# Patient Record
Sex: Female | Born: 1935 | Race: White | Hispanic: No | Smoking: Former smoker
Health system: Southern US, Community
[De-identification: ages and names within clinical notes are randomized; demographics above are authoritative.]

## PROBLEM LIST (undated history)

## (undated) DIAGNOSIS — E559 Vitamin D deficiency, unspecified: Secondary | ICD-10-CM

## (undated) DIAGNOSIS — F419 Anxiety disorder, unspecified: Secondary | ICD-10-CM

## (undated) DIAGNOSIS — M1990 Unspecified osteoarthritis, unspecified site: Secondary | ICD-10-CM

## (undated) DIAGNOSIS — D649 Anemia, unspecified: Secondary | ICD-10-CM

## (undated) DIAGNOSIS — G309 Alzheimer's disease, unspecified: Secondary | ICD-10-CM

## (undated) DIAGNOSIS — K219 Gastro-esophageal reflux disease without esophagitis: Secondary | ICD-10-CM

## (undated) DIAGNOSIS — F0390 Unspecified dementia without behavioral disturbance: Secondary | ICD-10-CM

## (undated) DIAGNOSIS — F0280 Dementia in other diseases classified elsewhere without behavioral disturbance: Secondary | ICD-10-CM

---

## 2001-02-16 ENCOUNTER — Encounter: Payer: Self-pay | Admitting: Orthopedic Surgery

## 2001-02-19 ENCOUNTER — Inpatient Hospital Stay (HOSPITAL_COMMUNITY): Admission: RE | Admit: 2001-02-19 | Discharge: 2001-02-23 | Payer: Self-pay | Admitting: Orthopedic Surgery

## 2001-02-19 ENCOUNTER — Encounter: Payer: Self-pay | Admitting: Orthopedic Surgery

## 2001-02-22 ENCOUNTER — Encounter: Payer: Self-pay | Admitting: Orthopedic Surgery

## 2001-02-23 ENCOUNTER — Inpatient Hospital Stay (HOSPITAL_COMMUNITY)
Admission: RE | Admit: 2001-02-23 | Discharge: 2001-03-04 | Payer: Self-pay | Admitting: Physical Medicine & Rehabilitation

## 2001-03-29 ENCOUNTER — Encounter (HOSPITAL_COMMUNITY): Admission: RE | Admit: 2001-03-29 | Discharge: 2001-04-28 | Payer: Self-pay | Admitting: Orthopedic Surgery

## 2001-04-30 ENCOUNTER — Encounter (HOSPITAL_COMMUNITY): Admission: RE | Admit: 2001-04-30 | Discharge: 2001-05-30 | Payer: Self-pay | Admitting: Orthopedic Surgery

## 2003-05-23 ENCOUNTER — Encounter (HOSPITAL_COMMUNITY): Admission: RE | Admit: 2003-05-23 | Discharge: 2003-06-22 | Payer: Self-pay | Admitting: Family Medicine

## 2003-12-08 ENCOUNTER — Encounter: Admission: RE | Admit: 2003-12-08 | Discharge: 2003-12-08 | Payer: Self-pay | Admitting: Orthopedic Surgery

## 2005-02-27 ENCOUNTER — Encounter: Admission: RE | Admit: 2005-02-27 | Discharge: 2005-02-27 | Payer: Self-pay | Admitting: Orthopedic Surgery

## 2005-09-21 ENCOUNTER — Inpatient Hospital Stay (HOSPITAL_COMMUNITY): Admission: EM | Admit: 2005-09-21 | Discharge: 2005-09-26 | Payer: Self-pay | Admitting: Emergency Medicine

## 2005-09-26 ENCOUNTER — Inpatient Hospital Stay: Admission: AD | Admit: 2005-09-26 | Discharge: 2005-12-05 | Payer: Self-pay | Admitting: Family Medicine

## 2005-09-29 ENCOUNTER — Ambulatory Visit: Payer: Self-pay | Admitting: Family Medicine

## 2005-09-30 ENCOUNTER — Ambulatory Visit (HOSPITAL_COMMUNITY): Admission: RE | Admit: 2005-09-30 | Discharge: 2005-09-30 | Payer: Self-pay | Admitting: Family Medicine

## 2005-10-07 ENCOUNTER — Ambulatory Visit: Payer: Self-pay | Admitting: Orthopedic Surgery

## 2005-10-09 ENCOUNTER — Ambulatory Visit (HOSPITAL_COMMUNITY): Admission: RE | Admit: 2005-10-09 | Discharge: 2005-10-09 | Payer: Self-pay | Admitting: Family Medicine

## 2005-10-14 ENCOUNTER — Ambulatory Visit (HOSPITAL_COMMUNITY): Admission: RE | Admit: 2005-10-14 | Discharge: 2005-10-14 | Payer: Self-pay | Admitting: Family Medicine

## 2005-10-27 ENCOUNTER — Ambulatory Visit: Payer: Self-pay | Admitting: Family Medicine

## 2005-10-27 ENCOUNTER — Ambulatory Visit: Payer: Self-pay | Admitting: Orthopedic Surgery

## 2005-10-31 ENCOUNTER — Emergency Department (HOSPITAL_COMMUNITY): Admission: EM | Admit: 2005-10-31 | Discharge: 2005-10-31 | Payer: Self-pay | Admitting: Emergency Medicine

## 2005-11-03 ENCOUNTER — Ambulatory Visit (HOSPITAL_COMMUNITY): Admission: RE | Admit: 2005-11-03 | Discharge: 2005-11-03 | Payer: Self-pay | Admitting: Oncology

## 2005-12-05 ENCOUNTER — Ambulatory Visit: Payer: Self-pay | Admitting: Family Medicine

## 2005-12-08 ENCOUNTER — Ambulatory Visit: Payer: Self-pay | Admitting: Orthopedic Surgery

## 2005-12-14 ENCOUNTER — Emergency Department (HOSPITAL_COMMUNITY): Admission: EM | Admit: 2005-12-14 | Discharge: 2005-12-14 | Payer: Self-pay | Admitting: Emergency Medicine

## 2005-12-31 ENCOUNTER — Ambulatory Visit: Payer: Self-pay | Admitting: Family Medicine

## 2006-01-30 ENCOUNTER — Ambulatory Visit: Payer: Self-pay | Admitting: Family Medicine

## 2006-01-31 ENCOUNTER — Encounter (INDEPENDENT_AMBULATORY_CARE_PROVIDER_SITE_OTHER): Payer: Self-pay | Admitting: Family Medicine

## 2006-02-02 ENCOUNTER — Ambulatory Visit: Payer: Self-pay | Admitting: Orthopedic Surgery

## 2006-02-25 ENCOUNTER — Ambulatory Visit: Payer: Self-pay | Admitting: Family Medicine

## 2006-03-30 ENCOUNTER — Ambulatory Visit: Payer: Self-pay | Admitting: Family Medicine

## 2006-04-16 IMAGING — CR DG CHEST 1V PORT
1 series · 1 of 1 positions shown · non-contrast
Comparison: Chest radiograph 09/21/05.

CLINICAL DATA: Fever, congestion. 
 PORTABLE CHEST:

[view not recorded]
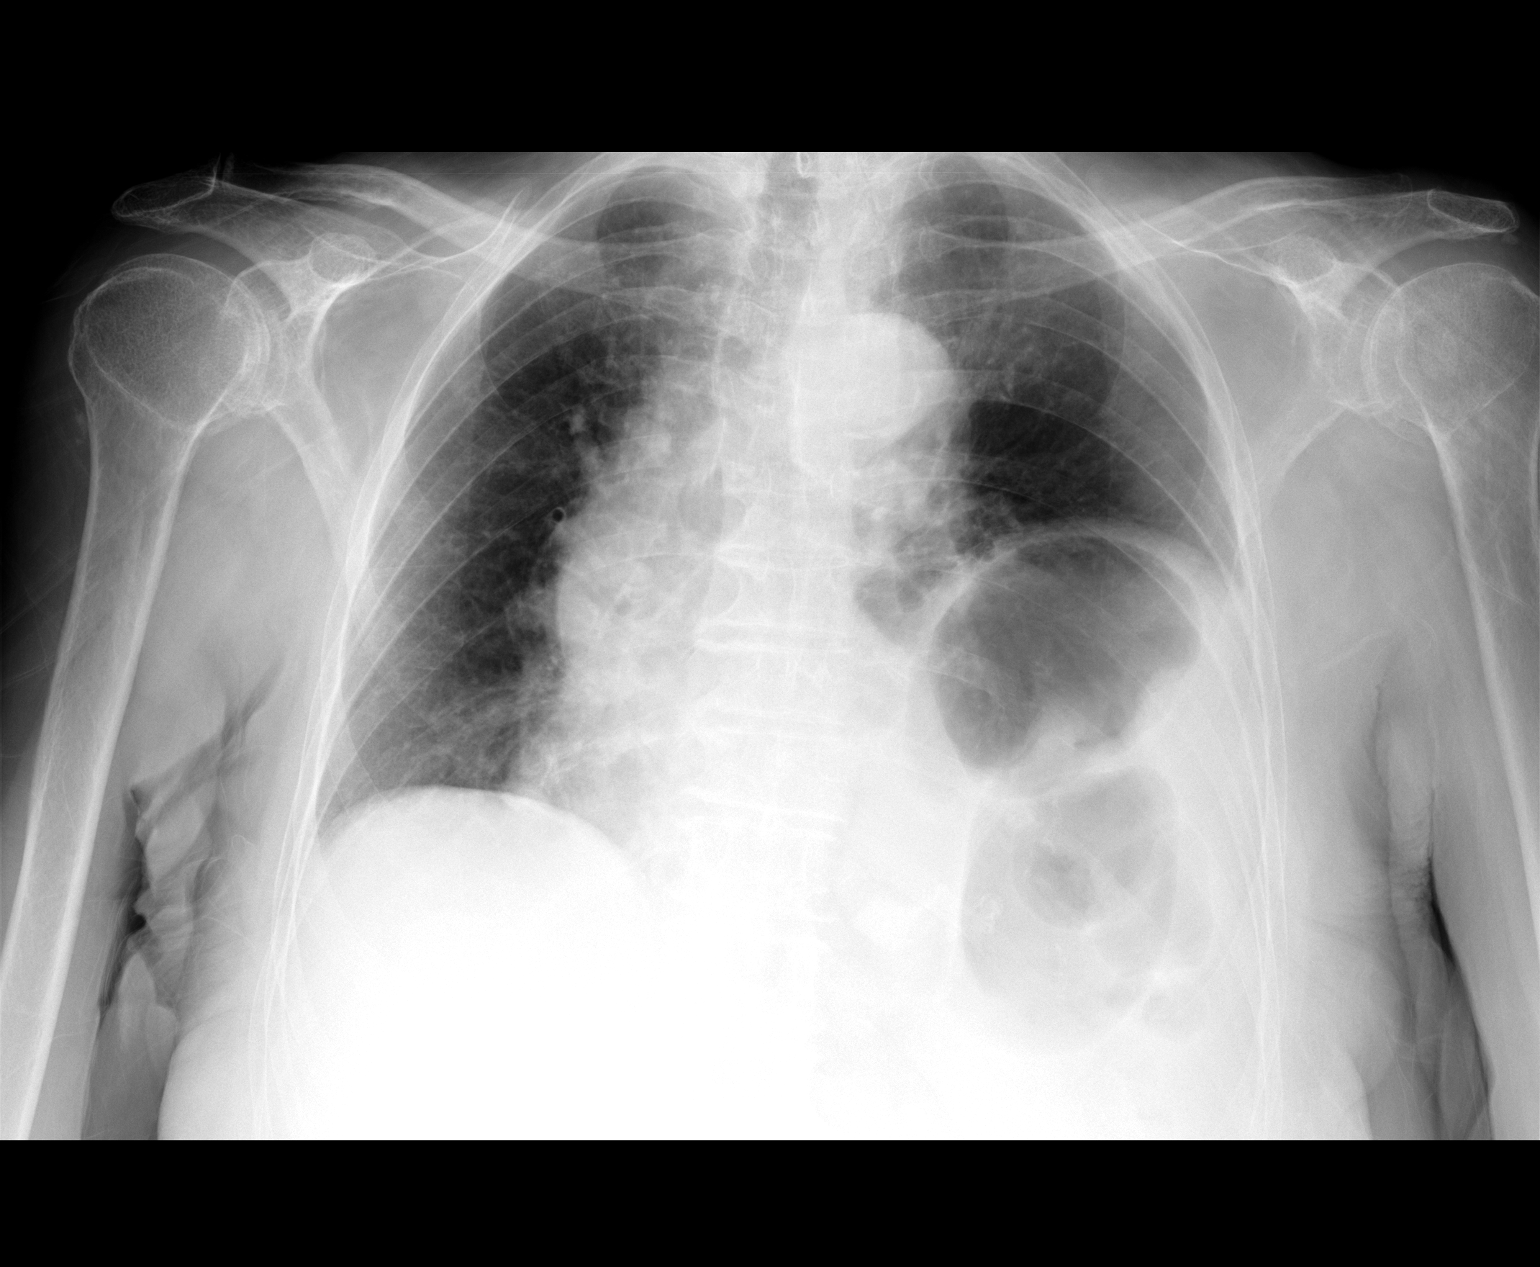

[1 of 1 positions shown; findings below may reference images not displayed]

FINDINGS: Heart size is normal.  The aorta is ectatic and unfolded, unchanged.  There is persistent elevation of the left hemidiaphragm.  Lungs are clear.  No effusion.  The bones are osteopenic with degenerative changes in the spine.
IMPRESSION: No acute cardiopulmonary process.

## 2006-04-19 IMAGING — CR DG HIP (WITH OR WITHOUT PELVIS) 2-3V*L*
4 series · 4 of 4 positions shown · non-contrast
Comparison: 09/21/05 and intraoperative views 09/23/05.

CLINICAL DATA: 85-year-old female status post ORIF left hip. 
LEFT HIP - 3 VIEW:

[view not recorded (1 of 4)]
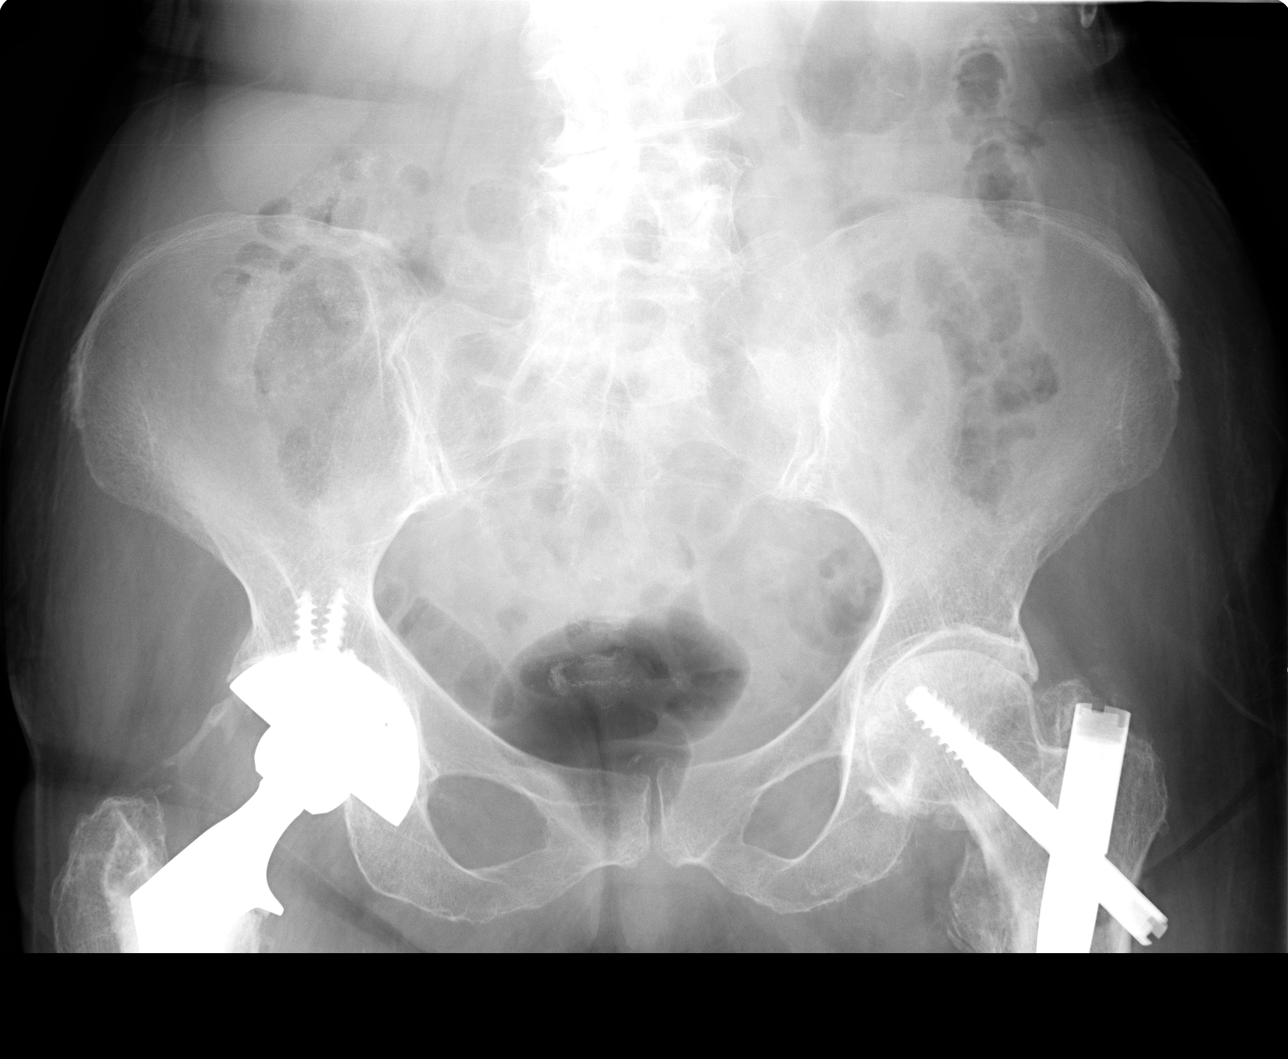

[view not recorded (2 of 4)]
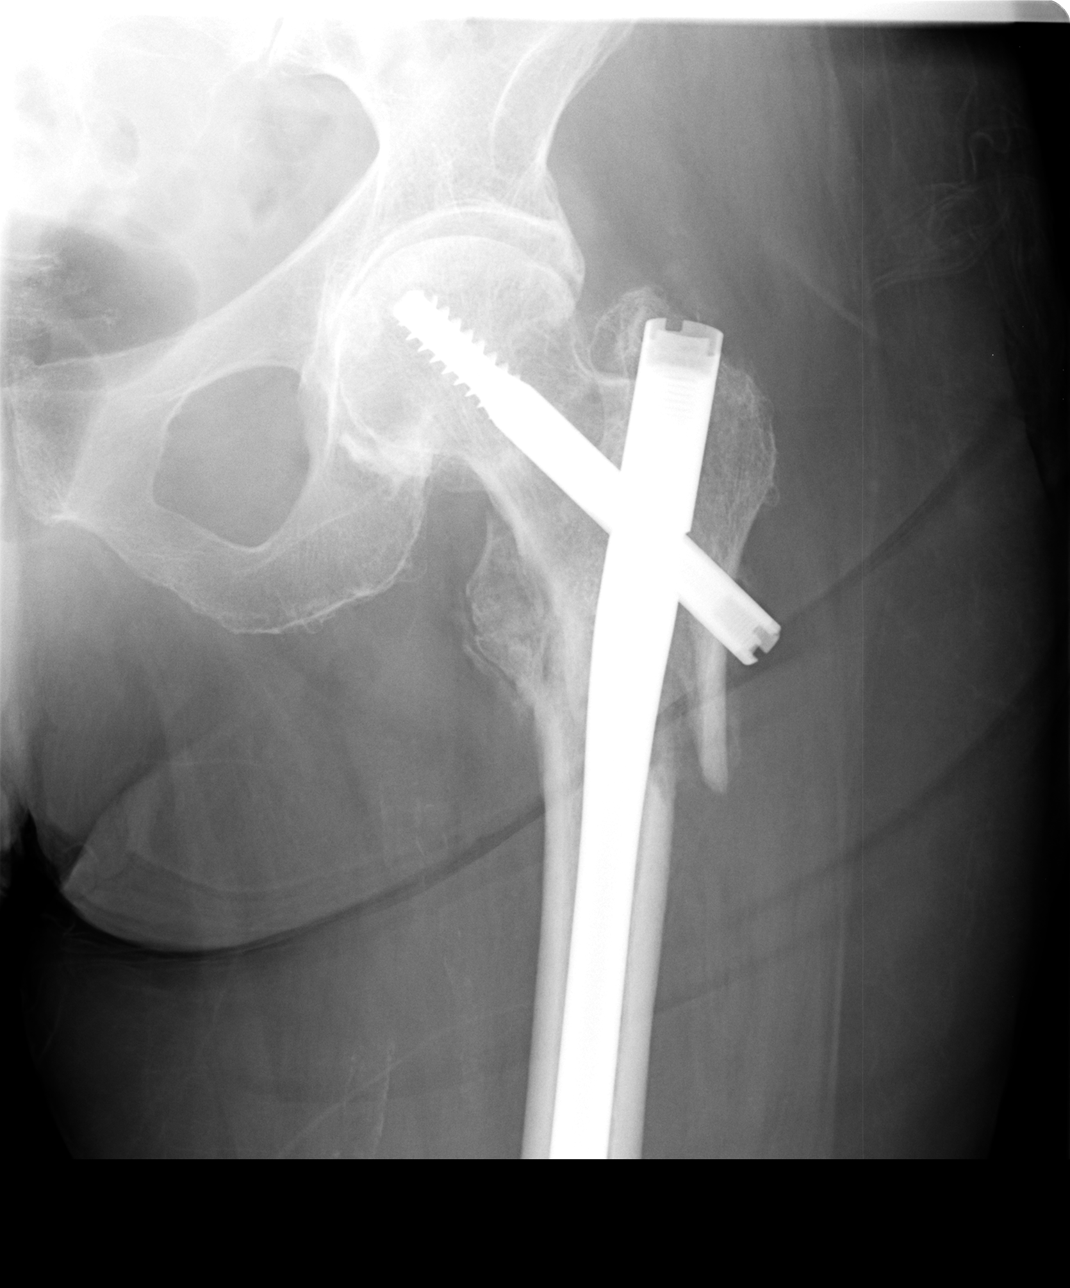

[view not recorded (3 of 4)]
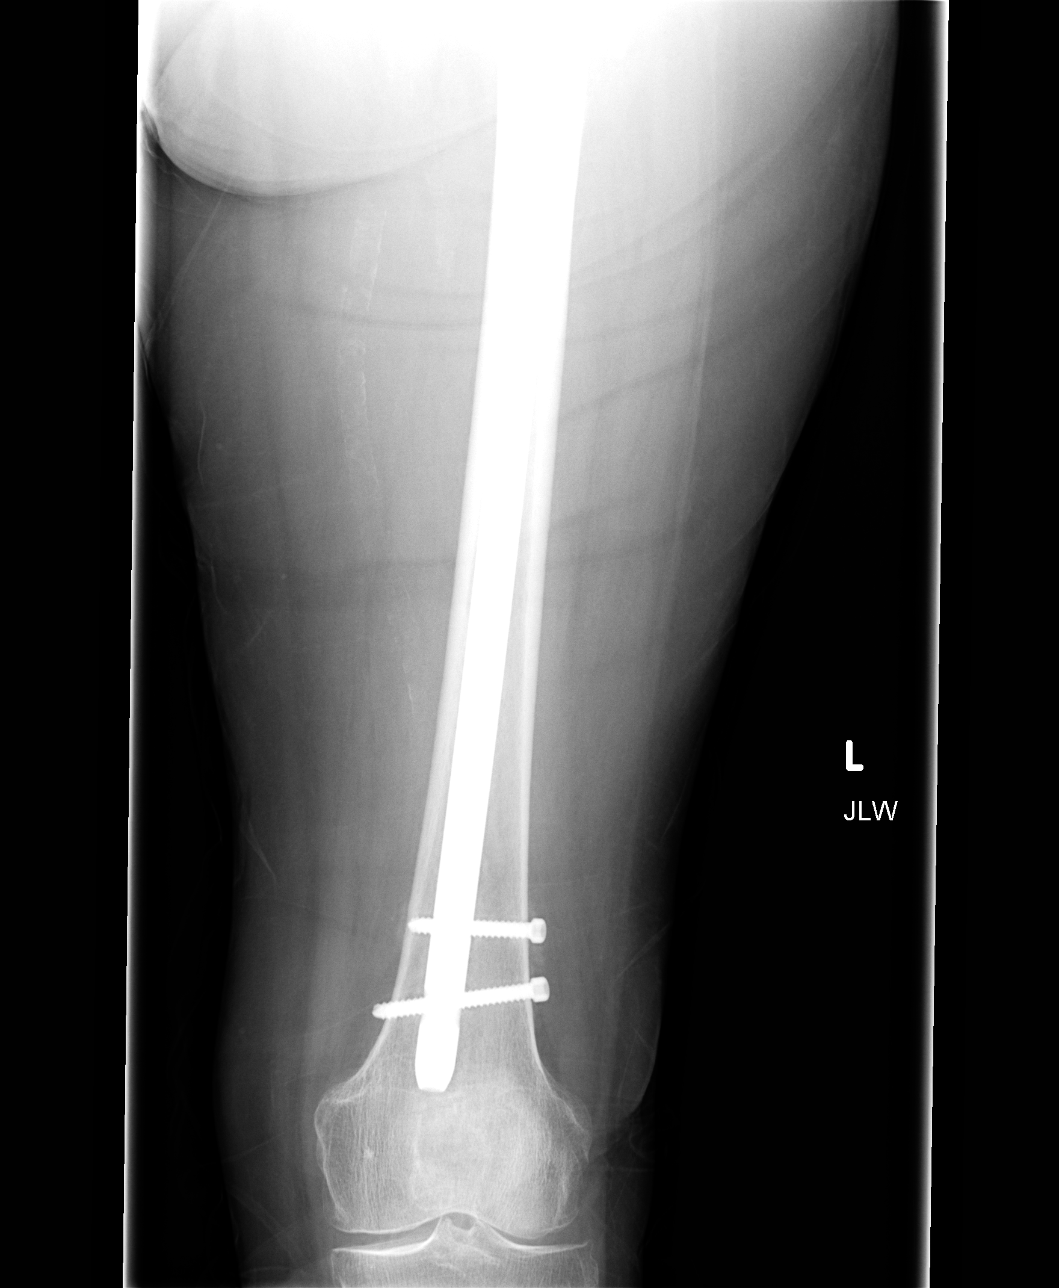

[view not recorded (4 of 4)]
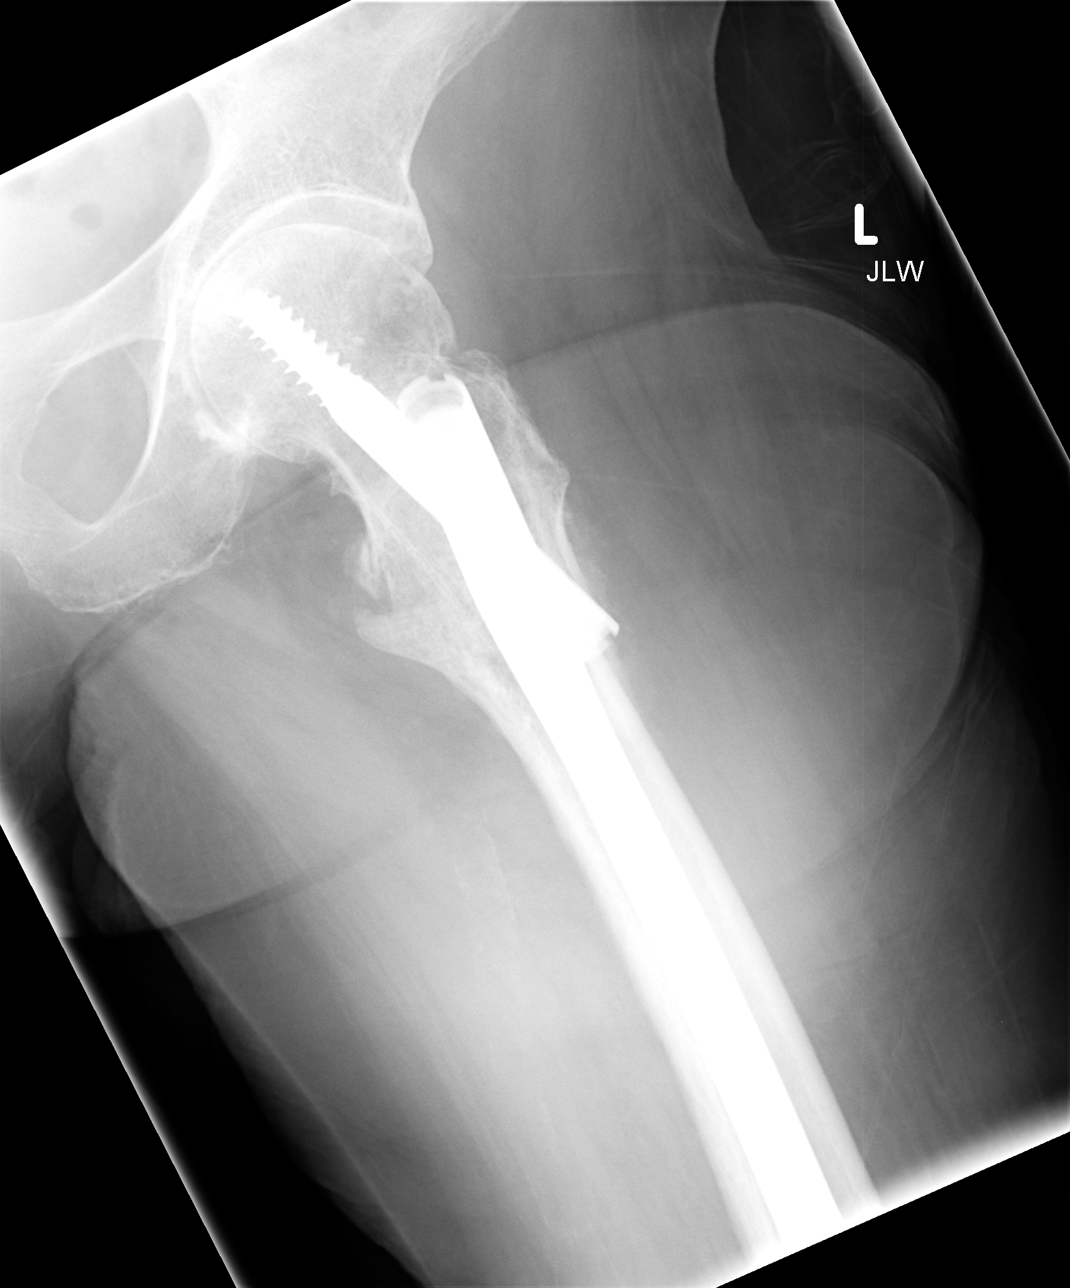

[4 of 4 positions shown; findings below may reference images not displayed]

FINDINGS: The patient is status post open reduction and internal fixation of the left hip.  The alignment is slightly displaced medially, although appears to be healing well.  There is minimal lucency about the transcervical neck screw which may represent hyperemia.  Hardware is otherwise intact.  Degenerative changes are noted in the hip joint.  Atherosclerotic changes are noted within the femoral artery.
IMPRESSION: 1.  Status post ORIF with some callus formation at the proximal femoral fracture site. 
2.  Mild lucency around the transcervical screw likely reflects hyperemia.  Infection cannot be entirely excluded.  Clinical correlation is recommended. 
3.  Osteoarthritic changes of the hip and knee.

## 2006-05-04 ENCOUNTER — Ambulatory Visit: Payer: Self-pay | Admitting: Orthopedic Surgery

## 2006-05-04 ENCOUNTER — Ambulatory Visit: Payer: Self-pay | Admitting: Family Medicine

## 2006-06-01 ENCOUNTER — Ambulatory Visit: Payer: Self-pay | Admitting: Family Medicine

## 2006-06-16 ENCOUNTER — Ambulatory Visit: Payer: Self-pay | Admitting: Family Medicine

## 2006-06-30 ENCOUNTER — Encounter: Payer: Self-pay | Admitting: Family Medicine

## 2006-06-30 DIAGNOSIS — F068 Other specified mental disorders due to known physiological condition: Secondary | ICD-10-CM

## 2006-06-30 DIAGNOSIS — F411 Generalized anxiety disorder: Secondary | ICD-10-CM | POA: Insufficient documentation

## 2006-06-30 DIAGNOSIS — M810 Age-related osteoporosis without current pathological fracture: Secondary | ICD-10-CM | POA: Insufficient documentation

## 2006-07-23 ENCOUNTER — Ambulatory Visit: Payer: Self-pay | Admitting: Family Medicine

## 2006-08-19 ENCOUNTER — Encounter (HOSPITAL_COMMUNITY): Admission: RE | Admit: 2006-08-19 | Discharge: 2006-09-18 | Payer: Self-pay | Admitting: Family Medicine

## 2006-10-07 ENCOUNTER — Ambulatory Visit (HOSPITAL_COMMUNITY): Admission: RE | Admit: 2006-10-07 | Discharge: 2006-10-07 | Payer: Self-pay | Admitting: Family Medicine

## 2006-10-07 ENCOUNTER — Ambulatory Visit: Payer: Self-pay | Admitting: Family Medicine

## 2006-10-07 DIAGNOSIS — D510 Vitamin B12 deficiency anemia due to intrinsic factor deficiency: Secondary | ICD-10-CM

## 2006-10-14 ENCOUNTER — Ambulatory Visit: Payer: Self-pay | Admitting: Family Medicine

## 2006-10-14 LAB — CONVERTED CEMR LAB
Protein, U semiquant: 30
pH: 5.5

## 2006-10-16 ENCOUNTER — Ambulatory Visit: Payer: Self-pay | Admitting: Family Medicine

## 2006-10-28 ENCOUNTER — Ambulatory Visit: Payer: Self-pay | Admitting: Family Medicine

## 2006-11-12 ENCOUNTER — Ambulatory Visit: Payer: Self-pay | Admitting: Family Medicine

## 2006-11-27 ENCOUNTER — Ambulatory Visit: Payer: Self-pay | Admitting: Family Medicine

## 2006-11-27 ENCOUNTER — Encounter (INDEPENDENT_AMBULATORY_CARE_PROVIDER_SITE_OTHER): Payer: Self-pay | Admitting: Internal Medicine

## 2006-12-15 ENCOUNTER — Telehealth (INDEPENDENT_AMBULATORY_CARE_PROVIDER_SITE_OTHER): Payer: Self-pay | Admitting: *Deleted

## 2006-12-15 ENCOUNTER — Encounter (INDEPENDENT_AMBULATORY_CARE_PROVIDER_SITE_OTHER): Payer: Self-pay | Admitting: Family Medicine

## 2006-12-22 ENCOUNTER — Encounter (INDEPENDENT_AMBULATORY_CARE_PROVIDER_SITE_OTHER): Payer: Self-pay | Admitting: Family Medicine

## 2006-12-29 ENCOUNTER — Ambulatory Visit: Payer: Self-pay | Admitting: Family Medicine

## 2007-01-01 ENCOUNTER — Encounter (INDEPENDENT_AMBULATORY_CARE_PROVIDER_SITE_OTHER): Payer: Self-pay | Admitting: Family Medicine

## 2007-01-07 ENCOUNTER — Encounter (INDEPENDENT_AMBULATORY_CARE_PROVIDER_SITE_OTHER): Payer: Self-pay | Admitting: Family Medicine

## 2007-01-14 ENCOUNTER — Encounter (INDEPENDENT_AMBULATORY_CARE_PROVIDER_SITE_OTHER): Payer: Self-pay | Admitting: Family Medicine

## 2007-01-15 ENCOUNTER — Encounter (INDEPENDENT_AMBULATORY_CARE_PROVIDER_SITE_OTHER): Payer: Self-pay | Admitting: Family Medicine

## 2007-01-21 ENCOUNTER — Ambulatory Visit: Payer: Self-pay | Admitting: Family Medicine

## 2007-01-22 ENCOUNTER — Encounter (INDEPENDENT_AMBULATORY_CARE_PROVIDER_SITE_OTHER): Payer: Self-pay | Admitting: Family Medicine

## 2007-01-22 ENCOUNTER — Telehealth (INDEPENDENT_AMBULATORY_CARE_PROVIDER_SITE_OTHER): Payer: Self-pay | Admitting: Family Medicine

## 2007-02-01 ENCOUNTER — Encounter (INDEPENDENT_AMBULATORY_CARE_PROVIDER_SITE_OTHER): Payer: Self-pay | Admitting: Family Medicine

## 2007-02-15 ENCOUNTER — Encounter (INDEPENDENT_AMBULATORY_CARE_PROVIDER_SITE_OTHER): Payer: Self-pay | Admitting: Family Medicine

## 2007-03-11 ENCOUNTER — Ambulatory Visit: Payer: Self-pay | Admitting: Family Medicine

## 2007-03-16 ENCOUNTER — Encounter (INDEPENDENT_AMBULATORY_CARE_PROVIDER_SITE_OTHER): Payer: Self-pay | Admitting: Family Medicine

## 2007-03-20 ENCOUNTER — Encounter (INDEPENDENT_AMBULATORY_CARE_PROVIDER_SITE_OTHER): Payer: Self-pay | Admitting: Family Medicine

## 2007-03-23 ENCOUNTER — Encounter (INDEPENDENT_AMBULATORY_CARE_PROVIDER_SITE_OTHER): Payer: Self-pay | Admitting: Family Medicine

## 2007-03-30 ENCOUNTER — Encounter (INDEPENDENT_AMBULATORY_CARE_PROVIDER_SITE_OTHER): Payer: Self-pay | Admitting: Family Medicine

## 2007-04-07 ENCOUNTER — Encounter (INDEPENDENT_AMBULATORY_CARE_PROVIDER_SITE_OTHER): Payer: Self-pay | Admitting: Family Medicine

## 2007-04-16 ENCOUNTER — Encounter (INDEPENDENT_AMBULATORY_CARE_PROVIDER_SITE_OTHER): Payer: Self-pay | Admitting: Family Medicine

## 2007-04-26 ENCOUNTER — Encounter (INDEPENDENT_AMBULATORY_CARE_PROVIDER_SITE_OTHER): Payer: Self-pay | Admitting: Family Medicine

## 2007-04-27 ENCOUNTER — Encounter (INDEPENDENT_AMBULATORY_CARE_PROVIDER_SITE_OTHER): Payer: Self-pay | Admitting: Family Medicine

## 2007-05-07 ENCOUNTER — Ambulatory Visit: Payer: Self-pay | Admitting: Family Medicine

## 2007-05-31 ENCOUNTER — Encounter (INDEPENDENT_AMBULATORY_CARE_PROVIDER_SITE_OTHER): Payer: Self-pay | Admitting: Family Medicine

## 2007-06-02 ENCOUNTER — Ambulatory Visit: Payer: Self-pay | Admitting: Family Medicine

## 2007-06-28 ENCOUNTER — Encounter (INDEPENDENT_AMBULATORY_CARE_PROVIDER_SITE_OTHER): Payer: Self-pay | Admitting: Family Medicine

## 2007-06-29 ENCOUNTER — Encounter (INDEPENDENT_AMBULATORY_CARE_PROVIDER_SITE_OTHER): Payer: Self-pay | Admitting: Family Medicine

## 2007-06-30 ENCOUNTER — Ambulatory Visit: Payer: Self-pay | Admitting: Family Medicine

## 2007-07-05 ENCOUNTER — Encounter (INDEPENDENT_AMBULATORY_CARE_PROVIDER_SITE_OTHER): Payer: Self-pay | Admitting: Family Medicine

## 2007-07-12 ENCOUNTER — Encounter (INDEPENDENT_AMBULATORY_CARE_PROVIDER_SITE_OTHER): Payer: Self-pay | Admitting: Family Medicine

## 2007-07-14 ENCOUNTER — Ambulatory Visit: Payer: Self-pay | Admitting: Family Medicine

## 2007-07-19 ENCOUNTER — Emergency Department (HOSPITAL_COMMUNITY): Admission: EM | Admit: 2007-07-19 | Discharge: 2007-07-20 | Payer: Self-pay | Admitting: *Deleted

## 2007-07-19 ENCOUNTER — Encounter (INDEPENDENT_AMBULATORY_CARE_PROVIDER_SITE_OTHER): Payer: Self-pay | Admitting: Family Medicine

## 2007-07-20 ENCOUNTER — Encounter (INDEPENDENT_AMBULATORY_CARE_PROVIDER_SITE_OTHER): Payer: Self-pay | Admitting: Family Medicine

## 2007-07-23 ENCOUNTER — Encounter (INDEPENDENT_AMBULATORY_CARE_PROVIDER_SITE_OTHER): Payer: Self-pay | Admitting: Family Medicine

## 2007-07-28 ENCOUNTER — Encounter (INDEPENDENT_AMBULATORY_CARE_PROVIDER_SITE_OTHER): Payer: Self-pay | Admitting: Family Medicine

## 2007-08-13 ENCOUNTER — Encounter (INDEPENDENT_AMBULATORY_CARE_PROVIDER_SITE_OTHER): Payer: Self-pay | Admitting: Family Medicine

## 2007-08-16 ENCOUNTER — Encounter (INDEPENDENT_AMBULATORY_CARE_PROVIDER_SITE_OTHER): Payer: Self-pay | Admitting: Family Medicine

## 2007-08-19 ENCOUNTER — Encounter (INDEPENDENT_AMBULATORY_CARE_PROVIDER_SITE_OTHER): Payer: Self-pay | Admitting: Family Medicine

## 2007-08-25 ENCOUNTER — Ambulatory Visit: Payer: Self-pay | Admitting: Family Medicine

## 2007-09-07 ENCOUNTER — Encounter (INDEPENDENT_AMBULATORY_CARE_PROVIDER_SITE_OTHER): Payer: Self-pay | Admitting: Family Medicine

## 2007-09-10 ENCOUNTER — Ambulatory Visit: Payer: Self-pay | Admitting: Family Medicine

## 2007-10-18 ENCOUNTER — Encounter (INDEPENDENT_AMBULATORY_CARE_PROVIDER_SITE_OTHER): Payer: Self-pay | Admitting: Family Medicine

## 2007-10-25 ENCOUNTER — Ambulatory Visit: Payer: Self-pay | Admitting: Family Medicine

## 2007-10-26 ENCOUNTER — Telehealth (INDEPENDENT_AMBULATORY_CARE_PROVIDER_SITE_OTHER): Payer: Self-pay | Admitting: *Deleted

## 2007-10-26 ENCOUNTER — Ambulatory Visit: Payer: Self-pay | Admitting: Family Medicine

## 2007-10-26 LAB — CONVERTED CEMR LAB
AST: 26 units/L (ref 0–37)
Alkaline Phosphatase: 67 units/L (ref 39–117)
Basophils Absolute: 0 10*3/uL (ref 0.0–0.1)
CO2: 18 meq/L — ABNORMAL LOW (ref 19–32)
Chloride: 106 meq/L (ref 96–112)
Eosinophils Absolute: 0.2 10*3/uL (ref 0.0–0.7)
Lymphocytes Relative: 26 % (ref 12–46)
Lymphs Abs: 1.2 10*3/uL (ref 0.7–4.0)
MCHC: 29.7 g/dL — ABNORMAL LOW (ref 30.0–36.0)
MCV: 100.3 fL — ABNORMAL HIGH (ref 78.0–100.0)
Monocytes Absolute: 0.5 10*3/uL (ref 0.1–1.0)
Monocytes Relative: 11 % (ref 3–12)
Neutro Abs: 2.8 10*3/uL (ref 1.7–7.7)
Platelets: 141 10*3/uL — ABNORMAL LOW (ref 150–400)
Potassium: 4.8 meq/L (ref 3.5–5.3)
Sodium: 142 meq/L (ref 135–145)
Total Bilirubin: 0.5 mg/dL (ref 0.3–1.2)
Total Protein: 7.1 g/dL (ref 6.0–8.3)
WBC: 4.7 10*3/uL (ref 4.0–10.5)

## 2007-10-27 ENCOUNTER — Telehealth (INDEPENDENT_AMBULATORY_CARE_PROVIDER_SITE_OTHER): Payer: Self-pay | Admitting: Family Medicine

## 2007-10-29 ENCOUNTER — Encounter (INDEPENDENT_AMBULATORY_CARE_PROVIDER_SITE_OTHER): Payer: Self-pay | Admitting: Family Medicine

## 2007-10-29 ENCOUNTER — Telehealth (INDEPENDENT_AMBULATORY_CARE_PROVIDER_SITE_OTHER): Payer: Self-pay | Admitting: Family Medicine

## 2007-11-01 LAB — CONVERTED CEMR LAB: Iron: 72 ug/dL (ref 42–145)

## 2007-11-03 ENCOUNTER — Telehealth (INDEPENDENT_AMBULATORY_CARE_PROVIDER_SITE_OTHER): Payer: Self-pay | Admitting: *Deleted

## 2007-11-03 ENCOUNTER — Encounter (INDEPENDENT_AMBULATORY_CARE_PROVIDER_SITE_OTHER): Payer: Self-pay | Admitting: Family Medicine

## 2007-11-04 ENCOUNTER — Encounter (INDEPENDENT_AMBULATORY_CARE_PROVIDER_SITE_OTHER): Payer: Self-pay | Admitting: Family Medicine

## 2007-11-16 ENCOUNTER — Encounter (INDEPENDENT_AMBULATORY_CARE_PROVIDER_SITE_OTHER): Payer: Self-pay | Admitting: Family Medicine

## 2007-11-17 ENCOUNTER — Encounter (INDEPENDENT_AMBULATORY_CARE_PROVIDER_SITE_OTHER): Payer: Self-pay | Admitting: Family Medicine

## 2007-11-22 ENCOUNTER — Encounter (INDEPENDENT_AMBULATORY_CARE_PROVIDER_SITE_OTHER): Payer: Self-pay | Admitting: Family Medicine

## 2007-12-03 ENCOUNTER — Encounter (INDEPENDENT_AMBULATORY_CARE_PROVIDER_SITE_OTHER): Payer: Self-pay | Admitting: Family Medicine

## 2007-12-07 ENCOUNTER — Ambulatory Visit: Payer: Self-pay | Admitting: Family Medicine

## 2007-12-07 DIAGNOSIS — J301 Allergic rhinitis due to pollen: Secondary | ICD-10-CM

## 2007-12-21 ENCOUNTER — Encounter (INDEPENDENT_AMBULATORY_CARE_PROVIDER_SITE_OTHER): Payer: Self-pay | Admitting: Family Medicine

## 2007-12-27 ENCOUNTER — Encounter (INDEPENDENT_AMBULATORY_CARE_PROVIDER_SITE_OTHER): Payer: Self-pay | Admitting: Family Medicine

## 2007-12-28 ENCOUNTER — Ambulatory Visit: Payer: Self-pay | Admitting: Family Medicine

## 2007-12-31 ENCOUNTER — Encounter (INDEPENDENT_AMBULATORY_CARE_PROVIDER_SITE_OTHER): Payer: Self-pay | Admitting: Family Medicine

## 2008-01-10 ENCOUNTER — Encounter (INDEPENDENT_AMBULATORY_CARE_PROVIDER_SITE_OTHER): Payer: Self-pay | Admitting: Family Medicine

## 2008-01-17 ENCOUNTER — Encounter (INDEPENDENT_AMBULATORY_CARE_PROVIDER_SITE_OTHER): Payer: Self-pay | Admitting: Family Medicine

## 2008-01-18 ENCOUNTER — Encounter (INDEPENDENT_AMBULATORY_CARE_PROVIDER_SITE_OTHER): Payer: Self-pay | Admitting: Family Medicine

## 2008-02-07 ENCOUNTER — Encounter (INDEPENDENT_AMBULATORY_CARE_PROVIDER_SITE_OTHER): Payer: Self-pay | Admitting: Family Medicine

## 2008-02-22 ENCOUNTER — Encounter (INDEPENDENT_AMBULATORY_CARE_PROVIDER_SITE_OTHER): Payer: Self-pay | Admitting: Family Medicine

## 2008-02-28 ENCOUNTER — Encounter (INDEPENDENT_AMBULATORY_CARE_PROVIDER_SITE_OTHER): Payer: Self-pay | Admitting: Family Medicine

## 2008-03-06 ENCOUNTER — Ambulatory Visit: Payer: Self-pay | Admitting: Family Medicine

## 2008-03-07 ENCOUNTER — Ambulatory Visit: Payer: Self-pay | Admitting: Family Medicine

## 2008-03-09 ENCOUNTER — Encounter (INDEPENDENT_AMBULATORY_CARE_PROVIDER_SITE_OTHER): Payer: Self-pay | Admitting: Family Medicine

## 2008-03-16 ENCOUNTER — Encounter (INDEPENDENT_AMBULATORY_CARE_PROVIDER_SITE_OTHER): Payer: Self-pay | Admitting: Family Medicine

## 2008-03-24 ENCOUNTER — Telehealth (INDEPENDENT_AMBULATORY_CARE_PROVIDER_SITE_OTHER): Payer: Self-pay | Admitting: *Deleted

## 2008-03-27 ENCOUNTER — Emergency Department (HOSPITAL_COMMUNITY): Admission: EM | Admit: 2008-03-27 | Discharge: 2008-03-27 | Payer: Self-pay | Admitting: Emergency Medicine

## 2008-03-27 ENCOUNTER — Encounter (INDEPENDENT_AMBULATORY_CARE_PROVIDER_SITE_OTHER): Payer: Self-pay | Admitting: Family Medicine

## 2008-04-20 ENCOUNTER — Encounter (INDEPENDENT_AMBULATORY_CARE_PROVIDER_SITE_OTHER): Payer: Self-pay | Admitting: Family Medicine

## 2008-04-21 ENCOUNTER — Encounter (INDEPENDENT_AMBULATORY_CARE_PROVIDER_SITE_OTHER): Payer: Self-pay | Admitting: Family Medicine

## 2008-05-04 ENCOUNTER — Encounter (INDEPENDENT_AMBULATORY_CARE_PROVIDER_SITE_OTHER): Payer: Self-pay | Admitting: Family Medicine

## 2008-05-09 ENCOUNTER — Ambulatory Visit: Payer: Self-pay | Admitting: Family Medicine

## 2008-05-15 ENCOUNTER — Encounter (INDEPENDENT_AMBULATORY_CARE_PROVIDER_SITE_OTHER): Payer: Self-pay | Admitting: Family Medicine

## 2008-05-24 ENCOUNTER — Encounter (INDEPENDENT_AMBULATORY_CARE_PROVIDER_SITE_OTHER): Payer: Self-pay | Admitting: Family Medicine

## 2008-05-25 ENCOUNTER — Encounter (INDEPENDENT_AMBULATORY_CARE_PROVIDER_SITE_OTHER): Payer: Self-pay | Admitting: Family Medicine

## 2008-06-06 ENCOUNTER — Ambulatory Visit: Payer: Self-pay | Admitting: Family Medicine

## 2008-06-07 ENCOUNTER — Encounter (INDEPENDENT_AMBULATORY_CARE_PROVIDER_SITE_OTHER): Payer: Self-pay | Admitting: Family Medicine

## 2008-06-08 LAB — CONVERTED CEMR LAB
Basophils Relative: 1 % (ref 0–1)
Eosinophils Absolute: 0.2 10*3/uL (ref 0.0–0.7)
Eosinophils Relative: 4 % (ref 0–5)
Lymphs Abs: 1.6 10*3/uL (ref 0.7–4.0)
MCHC: 32.7 g/dL (ref 30.0–36.0)
MCV: 101.2 fL — ABNORMAL HIGH (ref 78.0–100.0)
Monocytes Relative: 7 % (ref 3–12)
Platelets: 229 10*3/uL (ref 150–400)
RDW: 13.2 % (ref 11.5–15.5)

## 2008-06-13 ENCOUNTER — Encounter (INDEPENDENT_AMBULATORY_CARE_PROVIDER_SITE_OTHER): Payer: Self-pay | Admitting: Family Medicine

## 2008-06-13 ENCOUNTER — Ambulatory Visit: Payer: Self-pay | Admitting: Cardiology

## 2008-06-13 ENCOUNTER — Ambulatory Visit (HOSPITAL_COMMUNITY): Admission: RE | Admit: 2008-06-13 | Discharge: 2008-06-13 | Payer: Self-pay | Admitting: Family Medicine

## 2008-06-13 DIAGNOSIS — R0609 Other forms of dyspnea: Secondary | ICD-10-CM | POA: Insufficient documentation

## 2008-06-13 DIAGNOSIS — R0989 Other specified symptoms and signs involving the circulatory and respiratory systems: Secondary | ICD-10-CM

## 2008-06-21 ENCOUNTER — Ambulatory Visit: Payer: Self-pay | Admitting: Family Medicine

## 2008-06-23 ENCOUNTER — Ambulatory Visit: Payer: Self-pay | Admitting: Cardiology

## 2008-06-26 ENCOUNTER — Encounter (INDEPENDENT_AMBULATORY_CARE_PROVIDER_SITE_OTHER): Payer: Self-pay | Admitting: Family Medicine

## 2008-06-27 ENCOUNTER — Encounter (HOSPITAL_COMMUNITY): Admission: RE | Admit: 2008-06-27 | Discharge: 2008-07-27 | Payer: Self-pay | Admitting: Cardiology

## 2008-06-29 ENCOUNTER — Encounter (INDEPENDENT_AMBULATORY_CARE_PROVIDER_SITE_OTHER): Payer: Self-pay | Admitting: Family Medicine

## 2008-06-29 ENCOUNTER — Ambulatory Visit: Payer: Self-pay | Admitting: Cardiology

## 2008-07-10 ENCOUNTER — Ambulatory Visit: Payer: Self-pay | Admitting: Cardiology

## 2008-07-13 ENCOUNTER — Encounter (INDEPENDENT_AMBULATORY_CARE_PROVIDER_SITE_OTHER): Payer: Self-pay | Admitting: Family Medicine

## 2008-07-20 ENCOUNTER — Encounter (INDEPENDENT_AMBULATORY_CARE_PROVIDER_SITE_OTHER): Payer: Self-pay | Admitting: Family Medicine

## 2008-07-25 ENCOUNTER — Encounter (INDEPENDENT_AMBULATORY_CARE_PROVIDER_SITE_OTHER): Payer: Self-pay | Admitting: Family Medicine

## 2008-07-27 ENCOUNTER — Encounter (INDEPENDENT_AMBULATORY_CARE_PROVIDER_SITE_OTHER): Payer: Self-pay | Admitting: Family Medicine

## 2008-08-14 ENCOUNTER — Encounter (INDEPENDENT_AMBULATORY_CARE_PROVIDER_SITE_OTHER): Payer: Self-pay | Admitting: Family Medicine

## 2008-08-21 ENCOUNTER — Encounter (INDEPENDENT_AMBULATORY_CARE_PROVIDER_SITE_OTHER): Payer: Self-pay | Admitting: Family Medicine

## 2008-09-14 ENCOUNTER — Encounter (INDEPENDENT_AMBULATORY_CARE_PROVIDER_SITE_OTHER): Payer: Self-pay | Admitting: Family Medicine

## 2008-09-18 ENCOUNTER — Encounter (INDEPENDENT_AMBULATORY_CARE_PROVIDER_SITE_OTHER): Payer: Self-pay | Admitting: Family Medicine

## 2008-09-25 ENCOUNTER — Ambulatory Visit: Payer: Self-pay | Admitting: Family Medicine

## 2008-09-29 ENCOUNTER — Encounter (INDEPENDENT_AMBULATORY_CARE_PROVIDER_SITE_OTHER): Payer: Self-pay | Admitting: Family Medicine

## 2008-09-29 ENCOUNTER — Encounter: Payer: Self-pay | Admitting: Family Medicine

## 2008-10-17 ENCOUNTER — Encounter (INDEPENDENT_AMBULATORY_CARE_PROVIDER_SITE_OTHER): Payer: Self-pay | Admitting: Family Medicine

## 2008-11-08 ENCOUNTER — Encounter (INDEPENDENT_AMBULATORY_CARE_PROVIDER_SITE_OTHER): Payer: Self-pay | Admitting: Family Medicine

## 2008-11-09 ENCOUNTER — Encounter (INDEPENDENT_AMBULATORY_CARE_PROVIDER_SITE_OTHER): Payer: Self-pay | Admitting: Family Medicine

## 2008-11-10 ENCOUNTER — Encounter (INDEPENDENT_AMBULATORY_CARE_PROVIDER_SITE_OTHER): Payer: Self-pay | Admitting: Family Medicine

## 2008-11-21 ENCOUNTER — Encounter (INDEPENDENT_AMBULATORY_CARE_PROVIDER_SITE_OTHER): Payer: Self-pay | Admitting: Family Medicine

## 2008-11-22 ENCOUNTER — Ambulatory Visit: Payer: Self-pay | Admitting: Family Medicine

## 2008-12-05 ENCOUNTER — Ambulatory Visit: Payer: Self-pay | Admitting: Family Medicine

## 2008-12-05 DIAGNOSIS — R519 Headache, unspecified: Secondary | ICD-10-CM | POA: Insufficient documentation

## 2008-12-05 DIAGNOSIS — R51 Headache: Secondary | ICD-10-CM

## 2008-12-05 LAB — CONVERTED CEMR LAB
Glucose, Urine, Semiquant: NEGATIVE
Nitrite: NEGATIVE
Protein, U semiquant: NEGATIVE
Specific Gravity, Urine: <1.005
Urobilinogen, UA: 0.2

## 2008-12-06 ENCOUNTER — Encounter (INDEPENDENT_AMBULATORY_CARE_PROVIDER_SITE_OTHER): Payer: Self-pay | Admitting: Family Medicine

## 2008-12-07 LAB — CONVERTED CEMR LAB
AST: 23 units/L (ref 0–37)
Albumin: 4.4 g/dL (ref 3.5–5.2)
Alkaline Phosphatase: 59 units/L (ref 39–117)
BUN: 15 mg/dL (ref 6–23)
CO2: 26 meq/L (ref 19–32)
Calcium: 10.1 mg/dL (ref 8.4–10.5)
Chloride: 103 meq/L (ref 96–112)
Eosinophils Absolute: 0.1 10*3/uL (ref 0.0–0.7)
Eosinophils Relative: 2 % (ref 0–5)
Folate: >20 ng/mL
Glucose, Bld: 73 mg/dL (ref 70–99)
HCT: 41.2 % (ref 36.0–46.0)
HDL: 58 mg/dL (ref >39)
Neutro Abs: 4.1 10*3/uL (ref 1.7–7.7)
Neutrophils Relative %: 66 % (ref 43–77)
Potassium: 4.4 meq/L (ref 3.5–5.3)
RBC: 4.17 M/uL (ref 3.87–5.11)
RDW: 13 % (ref 11.5–15.5)
Sodium: 139 meq/L (ref 135–145)
Total Bilirubin: 0.5 mg/dL (ref 0.3–1.2)
Total Protein: 7.2 g/dL (ref 6.0–8.3)
Vitamin B-12: 753 pg/mL (ref 211–911)

## 2008-12-26 ENCOUNTER — Ambulatory Visit: Payer: Self-pay | Admitting: Family Medicine

## 2008-12-26 DIAGNOSIS — J209 Acute bronchitis, unspecified: Secondary | ICD-10-CM

## 2009-01-22 ENCOUNTER — Encounter (INDEPENDENT_AMBULATORY_CARE_PROVIDER_SITE_OTHER): Payer: Self-pay | Admitting: Family Medicine

## 2009-01-23 ENCOUNTER — Encounter (INDEPENDENT_AMBULATORY_CARE_PROVIDER_SITE_OTHER): Payer: Self-pay | Admitting: Family Medicine

## 2009-01-25 ENCOUNTER — Ambulatory Visit: Payer: Self-pay | Admitting: Family Medicine

## 2009-03-02 ENCOUNTER — Encounter (INDEPENDENT_AMBULATORY_CARE_PROVIDER_SITE_OTHER): Payer: Self-pay | Admitting: Family Medicine

## 2009-03-15 ENCOUNTER — Encounter (INDEPENDENT_AMBULATORY_CARE_PROVIDER_SITE_OTHER): Payer: Self-pay | Admitting: Family Medicine

## 2009-03-20 ENCOUNTER — Encounter (INDEPENDENT_AMBULATORY_CARE_PROVIDER_SITE_OTHER): Payer: Self-pay | Admitting: Family Medicine

## 2009-03-26 ENCOUNTER — Ambulatory Visit: Payer: Self-pay | Admitting: Family Medicine

## 2009-08-17 ENCOUNTER — Inpatient Hospital Stay (HOSPITAL_COMMUNITY): Admission: EM | Admit: 2009-08-17 | Discharge: 2009-08-19 | Payer: Self-pay | Admitting: Emergency Medicine

## 2010-02-22 ENCOUNTER — Observation Stay (HOSPITAL_COMMUNITY)
Admission: EM | Admit: 2010-02-22 | Discharge: 2010-02-23 | Payer: Self-pay | Source: Home / Self Care | Admitting: Emergency Medicine

## 2010-09-01 ENCOUNTER — Encounter: Payer: Self-pay | Admitting: Cardiology

## 2010-09-03 ENCOUNTER — Emergency Department (HOSPITAL_COMMUNITY)
Admission: EM | Admit: 2010-09-03 | Discharge: 2010-09-03 | Payer: Self-pay | Source: Home / Self Care | Admitting: Emergency Medicine

## 2010-09-04 LAB — COMPREHENSIVE METABOLIC PANEL
ALT: 15 U/L (ref 0–35)
BUN: 16 mg/dL (ref 6–23)
Glucose, Bld: 73 mg/dL (ref 70–99)
Total Protein: 7 g/dL (ref 6.0–8.3)

## 2010-09-04 LAB — DIFFERENTIAL
Eosinophils Absolute: 0.2 10*3/uL (ref 0.0–0.7)
Lymphocytes Relative: 21 % (ref 12–46)
Lymphs Abs: 1.5 10*3/uL (ref 0.7–4.0)
Monocytes Absolute: 0.5 10*3/uL (ref 0.1–1.0)
Neutro Abs: 5.3 10*3/uL (ref 1.7–7.7)
Neutrophils Relative %: 70 % (ref 43–77)

## 2010-09-04 LAB — LIPASE, BLOOD: Lipase: 54 U/L (ref 11–59)

## 2010-09-04 LAB — CBC
MCHC: 34.3 g/dL (ref 30.0–36.0)
MCV: 98.7 fL (ref 78.0–100.0)
Platelets: 185 10*3/uL (ref 150–400)
WBC: 7.5 10*3/uL (ref 4.0–10.5)

## 2010-09-04 LAB — URINALYSIS, ROUTINE W REFLEX MICROSCOPIC
Hgb urine dipstick: NEGATIVE
Ketones, ur: NEGATIVE mg/dL
Protein, ur: NEGATIVE mg/dL
Specific Gravity, Urine: <1.005 — ABNORMAL LOW (ref 1.005–1.030)
Urine Glucose, Fasting: NEGATIVE mg/dL
Urobilinogen, UA: 0.2 mg/dL (ref 0.0–1.0)

## 2010-09-04 LAB — CK TOTAL AND CKMB (NOT AT ARMC): Relative Index: INVALID (ref 0.0–2.5)

## 2010-09-04 LAB — TROPONIN I: Troponin I: 0.02 ng/mL (ref 0.00–0.06)

## 2010-09-08 LAB — CONVERTED CEMR LAB
ALT: 9 units/L (ref 0–35)
AST: 17 units/L (ref 0–37)
Albumin: 4.5 g/dL (ref 3.5–5.2)
BUN: 12 mg/dL (ref 6–23)
Basophils Absolute: 0 10*3/uL (ref 0.0–0.1)
Basophils Absolute: 0 10*3/uL (ref 0.0–0.1)
Basophils Relative: 0 % (ref 0–1)
Bilirubin Urine: NEGATIVE
Calcium: 9.5 mg/dL (ref 8.4–10.5)
Creatinine, Ser: 0.86 mg/dL (ref 0.40–1.20)
Eosinophils Absolute: 0.1 10*3/uL (ref 0.0–0.7)
Eosinophils Relative: 2 % (ref 0–5)
HCT: 45.6 % (ref 36.0–46.0)
Hemoglobin: 13.6 g/dL (ref 12.0–15.0)
Ketones, urine, test strip: NEGATIVE
Ketones, urine, test strip: NEGATIVE
Lymphocytes Relative: 20 % (ref 12–46)
Lymphocytes Relative: 22 % (ref 12–46)
Lymphs Abs: 1.4 10*3/uL (ref 0.7–3.3)
Lymphs Abs: 1.8 10*3/uL (ref 0.7–3.3)
MCHC: 31.8 g/dL (ref 30.0–36.0)
MCV: 101.3 fL — ABNORMAL HIGH (ref 78.0–100.0)
MCV: 105.6 fL — ABNORMAL HIGH (ref 78.0–100.0)
Monocytes Absolute: 0.4 10*3/uL (ref 0.2–0.7)
Monocytes Absolute: 0.6 10*3/uL (ref 0.2–0.7)
Neutrophils Relative %: 69 % (ref 43–77)
Potassium: 3.7 meq/L (ref 3.5–5.3)
Protein, U semiquant: 100
Protein, U semiquant: NEGATIVE
RBC: 3.93 M/uL (ref 3.87–5.11)
Specific Gravity, Urine: 1.015
Specific Gravity, Urine: >1.03 (ref 1.005–1.03)
Specific Gravity, Urine: >=1.03
TSH: 1.038 microintl units/mL (ref 0.350–5.50)
TSH: 1.297 microintl units/mL (ref 0.350–5.50)
Urine Glucose: NEGATIVE mg/dL
Urobilinogen, UA: 0.2
Urobilinogen, UA: 0.2 (ref 0.0–1.0)
pH: 5.5 (ref 5.0–8.0)

## 2010-09-10 NOTE — Letter (Signed)
Summary: history and physical   history and physical   Imported By: Curtis Sites 03/20/2009 15:04:22  _____________________________________________________________________  External Attachment:    Type:   Image     Comment:   External Document

## 2010-10-26 LAB — URINALYSIS, ROUTINE W REFLEX MICROSCOPIC
Bilirubin Urine: NEGATIVE
Hgb urine dipstick: NEGATIVE
Ketones, ur: NEGATIVE mg/dL
Nitrite: NEGATIVE

## 2010-10-26 LAB — COMPREHENSIVE METABOLIC PANEL
AST: 23 U/L (ref 0–37)
AST: 24 U/L (ref 0–37)
Albumin: 3.8 g/dL (ref 3.5–5.2)
Albumin: 3.9 g/dL (ref 3.5–5.2)
BUN: 12 mg/dL (ref 6–23)
BUN: 13 mg/dL (ref 6–23)
Calcium: 9.1 mg/dL (ref 8.4–10.5)
Calcium: 9.3 mg/dL (ref 8.4–10.5)
Chloride: 106 mEq/L (ref 96–112)
Chloride: 107 mEq/L (ref 96–112)
Creatinine, Ser: 0.76 mg/dL (ref 0.4–1.2)
Creatinine, Ser: 0.86 mg/dL (ref 0.4–1.2)
GFR calc Af Amer: >60 mL/min (ref >60)
GFR calc Af Amer: >60 mL/min (ref >60)
GFR calc non Af Amer: >60 mL/min (ref >60)
Total Bilirubin: 0.5 mg/dL (ref 0.3–1.2)
Total Bilirubin: 0.6 mg/dL (ref 0.3–1.2)
Total Protein: 6.7 g/dL (ref 6.0–8.3)

## 2010-10-26 LAB — DIFFERENTIAL
Eosinophils Relative: 3 % (ref 0–5)
Lymphocytes Relative: 24 % (ref 12–46)
Lymphs Abs: 2.3 10*3/uL (ref 0.7–4.0)
Monocytes Absolute: 0.7 10*3/uL (ref 0.1–1.0)
Monocytes Relative: 8 % (ref 3–12)
Neutro Abs: 6.3 10*3/uL (ref 1.7–7.7)

## 2010-10-26 LAB — CARDIAC PANEL(CRET KIN+CKTOT+MB+TROPI)
Relative Index: INVALID (ref 0.0–2.5)
Total CK: 75 U/L (ref 7–177)
Troponin I: 0.02 ng/mL (ref 0.00–0.06)

## 2010-10-26 LAB — URINE CULTURE
Colony Count: NO GROWTH
Culture: NO GROWTH

## 2010-10-26 LAB — CBC
HCT: 37.8 % (ref 36.0–46.0)
Hemoglobin: 12.9 g/dL (ref 12.0–15.0)
MCH: 34.2 pg — ABNORMAL HIGH (ref 26.0–34.0)
MCH: 34.2 pg — ABNORMAL HIGH (ref 26.0–34.0)
MCHC: 34.1 g/dL (ref 30.0–36.0)
MCHC: 34.2 g/dL (ref 30.0–36.0)
MCV: 100 fL (ref 78.0–100.0)
MCV: 100.4 fL — ABNORMAL HIGH (ref 78.0–100.0)
Platelets: 164 10*3/uL (ref 150–400)
Platelets: 170 10*3/uL (ref 150–400)
RBC: 3.78 MIL/uL — ABNORMAL LOW (ref 3.87–5.11)
RBC: 3.91 MIL/uL (ref 3.87–5.11)
RDW: 13.3 % (ref 11.5–15.5)
WBC: 9.6 10*3/uL (ref 4.0–10.5)

## 2010-10-26 LAB — BLOOD GAS, ARTERIAL
TCO2: 23.8 mmol/L (ref 0–100)
pCO2 arterial: 45.9 mmHg — ABNORMAL HIGH (ref 35.0–45.0)
pH, Arterial: 7.38 (ref 7.350–7.400)
pO2, Arterial: 73.5 mmHg — ABNORMAL LOW (ref 80.0–100.0)

## 2010-10-26 LAB — POCT CARDIAC MARKERS
CKMB, poc: <1 ng/mL — ABNORMAL LOW (ref 1.0–8.0)
Myoglobin, poc: 76.3 ng/mL (ref 12–200)
Troponin i, poc: <0.05 ng/mL (ref 0.00–0.09)

## 2010-10-26 LAB — VITAMIN B12: Vitamin B-12: 1015 pg/mL — ABNORMAL HIGH (ref 211–911)

## 2010-10-26 LAB — TSH: TSH: 1.673 u[IU]/mL (ref 0.350–4.500)

## 2010-10-27 LAB — COMPREHENSIVE METABOLIC PANEL
ALT: 16 U/L (ref 0–35)
AST: 26 U/L (ref 0–37)
AST: 34 U/L (ref 0–37)
Albumin: 3.4 g/dL — ABNORMAL LOW (ref 3.5–5.2)
BUN: 14 mg/dL (ref 6–23)
CO2: 24 mEq/L (ref 19–32)
Chloride: 104 mEq/L (ref 96–112)
Creatinine, Ser: 0.78 mg/dL (ref 0.4–1.2)
Creatinine, Ser: 0.96 mg/dL (ref 0.4–1.2)
GFR calc Af Amer: >60 mL/min (ref >60)
GFR calc non Af Amer: 55 mL/min — ABNORMAL LOW (ref >60)
Potassium: 4.1 mEq/L (ref 3.5–5.1)
Sodium: 140 mEq/L (ref 135–145)
Sodium: 140 mEq/L (ref 135–145)
Total Bilirubin: 0.5 mg/dL (ref 0.3–1.2)
Total Protein: 6.2 g/dL (ref 6.0–8.3)

## 2010-10-27 LAB — BLOOD GAS, ARTERIAL
Acid-Base Excess: 2.8 mmol/L — ABNORMAL HIGH (ref 0.0–2.0)
Bicarbonate: 26.9 mEq/L — ABNORMAL HIGH (ref 20.0–24.0)
Bicarbonate: 30 mEq/L — ABNORMAL HIGH (ref 20.0–24.0)
O2 Saturation: 89.7 %
O2 Saturation: 91 %
Patient temperature: 37
Patient temperature: 37
TCO2: 24.2 mmol/L (ref 0–100)
TCO2: 27.7 mmol/L (ref 0–100)
pCO2 arterial: 60.7 mmHg (ref 35.0–45.0)
pH, Arterial: 7.316 — ABNORMAL LOW (ref 7.350–7.400)
pH, Arterial: 7.388 (ref 7.350–7.400)
pO2, Arterial: 21.8 mmHg — CL (ref 80.0–100.0)

## 2010-10-27 LAB — CBC
HCT: 40.4 % (ref 36.0–46.0)
Hemoglobin: 12.8 g/dL (ref 12.0–15.0)
Hemoglobin: 12.9 g/dL (ref 12.0–15.0)
Hemoglobin: 13.6 g/dL (ref 12.0–15.0)
MCHC: 33.7 g/dL (ref 30.0–36.0)
MCHC: 33.8 g/dL (ref 30.0–36.0)
MCV: 101.3 fL — ABNORMAL HIGH (ref 78.0–100.0)
MCV: 102.2 fL — ABNORMAL HIGH (ref 78.0–100.0)
Platelets: 175 10*3/uL (ref 150–400)
RBC: 3.7 MIL/uL — ABNORMAL LOW (ref 3.87–5.11)
RBC: 3.73 MIL/uL — ABNORMAL LOW (ref 3.87–5.11)
RBC: 3.99 MIL/uL (ref 3.87–5.11)
RDW: 13.4 % (ref 11.5–15.5)
WBC: 4.6 10*3/uL (ref 4.0–10.5)
WBC: 6.1 10*3/uL (ref 4.0–10.5)

## 2010-10-27 LAB — URINALYSIS, ROUTINE W REFLEX MICROSCOPIC
Bilirubin Urine: NEGATIVE
Glucose, UA: NEGATIVE mg/dL
Hgb urine dipstick: NEGATIVE
Ketones, ur: NEGATIVE mg/dL
Nitrite: NEGATIVE
Protein, ur: NEGATIVE mg/dL
Specific Gravity, Urine: 1.01 (ref 1.005–1.030)
Urobilinogen, UA: 0.2 mg/dL (ref 0.0–1.0)
pH: 5.5 (ref 5.0–8.0)

## 2010-10-27 LAB — BASIC METABOLIC PANEL WITH GFR
CO2: 33 meq/L — ABNORMAL HIGH (ref 19–32)
Chloride: 100 meq/L (ref 96–112)
GFR calc Af Amer: >60 mL/min (ref >60)
Potassium: 3.5 meq/L (ref 3.5–5.1)
Sodium: 141 meq/L (ref 135–145)

## 2010-10-27 LAB — BASIC METABOLIC PANEL
BUN: 11 mg/dL (ref 6–23)
Calcium: 9 mg/dL (ref 8.4–10.5)
Creatinine, Ser: 0.96 mg/dL (ref 0.4–1.2)
GFR calc non Af Amer: 55 mL/min — ABNORMAL LOW (ref >60)
Glucose, Bld: 142 mg/dL — ABNORMAL HIGH (ref 70–99)

## 2010-10-27 LAB — DIFFERENTIAL
Basophils Absolute: 0 10*3/uL (ref 0.0–0.1)
Basophils Absolute: 0 10*3/uL (ref 0.0–0.1)
Basophils Relative: 0 % (ref 0–1)
Basophils Relative: 0 % (ref 0–1)
Eosinophils Absolute: 0 10*3/uL (ref 0.0–0.7)
Eosinophils Absolute: 0.2 K/uL (ref 0.0–0.7)
Eosinophils Relative: 0 % (ref 0–5)
Eosinophils Relative: 0 % (ref 0–5)
Eosinophils Relative: 3 % (ref 0–5)
Lymphocytes Relative: 25 % (ref 12–46)
Lymphocytes Relative: 6 % — ABNORMAL LOW (ref 12–46)
Lymphs Abs: 1.5 K/uL (ref 0.7–4.0)
Monocytes Absolute: 0.6 10*3/uL (ref 0.1–1.0)
Monocytes Absolute: 0.8 K/uL (ref 0.1–1.0)
Monocytes Relative: 14 % — ABNORMAL HIGH (ref 3–12)
Monocytes Relative: 6 % (ref 3–12)
Neutro Abs: 3.5 10*3/uL (ref 1.7–7.7)
Neutro Abs: 9.7 10*3/uL — ABNORMAL HIGH (ref 1.7–7.7)
Neutrophils Relative %: 58 % (ref 43–77)

## 2010-12-24 NOTE — Letter (Signed)
July 10, 2008    Franchot Heidelberg, MD  [address]  [place], [place]  [address]   RE:  Kristine Lewis, Kristine Lewis  MRN:  [id number]  /  DOB:  April 19, 1920   Dear Remi Haggard,   [name] returns to the office for continued assessment and  treatment of dyspnea on exertion.  The threshold for development of her  symptoms continues to be variable.  She is generally satisfied with her  physical capabilities.   Fluoroscopy of the chest was performed by radiology.  They report no  motion of the left hemidiaphragm with inspiration and paradoxic motion  with decreased intrathoracic pressure.  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  Her stress nuclear study was limited by this anatomic  anomaly.  Due to the elevated left hemidiaphragm, intense bowel activity  was superimposed upon cardiac activity raising questions about the  accuracy of this study.  There was evidence for scarring in the  distribution of the left anterior descending coronary.  Interestingly,  the echocardiogram also indicated wall motion abnormality in that area.   PHYSICAL EXAMINATION:  GENERAL:  Pleasant older woman in no acute  distress.  VITAL SIGNS:  The weight is 160, 2 pounds more than at her last visit.  Blood pressure 110/70, heart rate 70 and regular, respirations 14.  NECK:  No jugular venous distention.  LUNGS:  Decreased breath sounds at the left base; some dullness to  percussion at the left base.  CARDIAC:  Normal first and second heart sounds; trivial systolic murmur.  EXTREMITIES:  No edema.   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  Reviewing images available to me back to 2007, there has been no  interval change.  Even in 2002, a report notes left hemidiaphragmatic  elevation.  I am unable to find anything in her history that would  account for denervation of the diaphragm, but apparently this did occur  some years ago and may account for her current  symptoms.   I cannot unequivocally exclude coronary disease; however, a single area  of scarring in a woman of this age would not lead me to recommend  coronary angiography.  I cautioned the patient and her daughter that  they should report any increase in symptoms or the onset of chest  discomfort or persistent chest discomfort to the staff at [facility].  In the absence of that development, I do not think Kristine Lewis  needs to see me in the future.  You could consider referral to a  pulmonologist or pulmonary function test to quantify the magnitude of  her impairment in lung function, but I do not think that either of those  would make a difference in her symptoms or course.   Thank you so much for sending this nice woman to see me.    Sincerely,      Gerrit Friends. Dietrich Pates, MD, [facility]  Electronically Signed    RMR/MedQ  DD: 07/10/2008  DT: 07/11/2008  Job #: [id number]

## 2010-12-24 NOTE — Letter (Signed)
June 23, 2008    Franchot Heidelberg, MD  [address]  [place], [place]  [address]   RE:  OMAYA, NIELAND  MRN:  [id number]  /  DOB:  01-16-1920   Dear Remi Haggard,   Ms. Barrilleaux was seen in the office today in consultation at your  request for dyspnea.  She has a history of dementia, and her description  of her immediate problems is somewhat suspect.  She clearly has no  symptoms at rest.  She participates in a low-level exercise class in the  nursing home where she resides and apparently sometimes notes dyspnea.  This is not a daily occurrence.  She has no history of cardiac problems.  She has had no lung disease.  Initial evaluation shows insignificant  aortic valve disease and normal left ventricular systolic function on  echocardiography, apparently relatively unremarkable spirometry, and no  parenchymal disease on chest x-ray, but an elevated left hemidiaphragm  was noted.  Ms. Po denies any chest discomfort.  She has no  orthopnea and no PND.  She has had no pedal edema.   Past medical history is notable for dementia, osteoporosis, pernicious  anemia, a history of seizure disorder, and anxiety.   Current medications include Zantac 150 mg daily, Flonase, monthly B12  injections, Lexapro 20 mg daily, Namenda 10 mg b.i.d., and calcium plus  vitamin D.   A drug allergy to SULFA PREPARATIONS is reported.   SOCIAL HISTORY:  Retired; widowed with 3 adult children; no use of  tobacco products or alcohol.   FAMILY HISTORY:  Father died at advanced age with myocardial infarction;  mother died at age 31 with colon cancer.  One brother has Alzheimer at  age 35.  A sister had colon cancer at age 75.   Review of systems is notable for the need for corrective lenses.  She  has some arthritic discomfort in her left leg.  She walks with a walker.  She has not suffered any falls.  All other systems reviewed and are  negative.   PHYSICAL EXAMINATION:  GENERAL:   Pleasant woman who is conversant and in  no acute distress.  VITAL SIGNS:  The weight is 158.  Blood pressure 140/90, heart rate 95  and regular, temperature 98.3.  HEENT:  EOMs full; normal lids and conjunctivae; normal oral mucosa.  NECK:  No jugular venous distention; normal carotid upstrokes without  bruits.  LUNGS:  Dullness to percussion at the left base; decreased breath  sounds.  CARDIAC:  Normal first and second heart sounds; minimal systolic  ejection murmur.  ABDOMEN:  Soft and nontender; no organomegaly; no masses; normal bowel  sounds.  EXTREMITIES:  No edema; normal distal pulses.  NEUROLOGIC:  Symmetric strength and tone; normal cranial nerves.   EKG:  Normal sinus rhythm; delayed R-wave progression; otherwise, within  normal limits.   A 6-minute walk test was performed.  The patient covered 800 feet with a  walker and reported no dyspnea.  She did develop a cough.  O2 saturation  remained at 92% and above throughout.  Her heart rate at the end of  exercise was 96 in sinus rhythm.   IMPRESSION:  Ms. Moskal reports dyspnea but has good exercise  tolerance and no symptoms in the office.  I suspect that we will not  find a significant underlying physiologic issue.  CBC from your office  shows that she has no anemia.  We will proceed with a stress nuclear  study to rule out coronary disease.  She has no significant valvular  disease.  Due to the abnormality on chest x-ray and by exam, I will ask  the radiologist to perform fluoroscopy of the chest to verify  diaphragmatic movement bilaterally.  I will reassess this nice woman  once those studies have been performed.   Thank you so much for sending her to see me.    Sincerely,      Gerrit Friends. Dietrich Pates, MD, [facility]  Electronically Signed    RMR/MedQ  DD: 06/23/2008  DT: 06/24/2008  Job #: [id number]

## 2010-12-27 NOTE — Discharge Summary (Signed)
[place]. [facility]  Patient:    Kristine Lewis, Kristine Lewis Visit Number: [id number] MRN: [id number]          Service Type: [facility] Location: [id number] Attending Physician:  Ranelle Oyster Dictated by:   Oris Drone Petrarca, P.A.-C. Adm. Date:  02/23/2001 Disc. Date: 03/04/2001                             Discharge Summary  ADMISSION DIAGNOSIS:  Advanced degenerative joint disease of the right hip.  DISCHARGE DIAGNOSIS:  Advanced degenerative joint disease of the right hip.  PROCEDURE:  Right total hip replacement.  HISTORY:  A 77 year old white female with progressive right hip pain and discomfort.  She now is having pain with all activities including her activities of daily living.  She has referred pain into the groin.  She is now indicated for total hip replacement.  HOSPITAL COURSE:  A 77 year old white female admitted February 19, 2001.  After appropriate laboratory studies were obtained, as well as 1 gram Ancef IV on call to the operating room, she was taken to the operating room where she underwent a right total hip replacement.  She tolerated the procedure well. She was begun on a morphine PCA pump postoperatively.  She was placed also with heparin 5000 units subcutaneous q.12h. until her Coumadin became therapeutic as per protocol.  Consultations with PT and OT, rehab and social services were obtained.  She was allowed to ambulate weight bearing as tolerated on the right with a walker.  Total hip protocol was used.  She did well postoperatively and though did have some difficulty with anemia postoperative.  She was given a transfusion on February 21, 2001.  She did have some difficulty with some chest pain on February 22, 2001.  Cardiac enzymes, chest x-ray and EKG were obtained.  They essentially were unremarkable showing no cardiac events.  She was transferred to rehab on February 23, 2001 where she will continue with her physical and occupational therapy.  EKG  of February 16, 2001 showed normal sinus rhythm, nonspecific T wave abnormality.  EKG of February 22, 2001 reveals sinus tachycardia, otherwise normal.  Chest x-ray of February 22, 2001 showed elevated left hemidiaphragm with atelectasis of the left base. Hip of February 19, 2001:  Right hip reveals femoral component, appears well seated in the acetabular component.  LABORATORY DATA:  February 16, 2001 showed hemoglobin 14.9, hematocrit 43.1%, white count 6700, platelets 267,000.  February 23, 2001 revealed a hemoglobin of 9.1, hematocrit 26.0%, white count 5300, platelets 176,000.  Chemistry on February 16, 2001 reveals sodium 140, potassium 4.1, chloride 104, CO2 26, glucose 92, BUN 8, creatinine 0.8, calcium 10.1, total protein 7.1, albumin 4.3, AST 50, ALT 95, ALP 114 and total bilirubin 0.8.  February 23, 2001 reveals sodium 137, potassium 3.6, chloride 106, CO2 31, glucose 120, BUN 6, creatinine 0.7, calcium 7.9.  Enzymes on February 22, 2001 showed a CK of 269, MB 0.9, index 0.3, troponin was 0.01.  Urinalysis was benign for voided urine.  She was blood type 0 negative.  Antibody was positive for antiD.  She was transfused one unit of blood.  DISCHARGE INSTRUCTIONS:  She will continue with her physical and occupational therapy in the rehab section.  Ambulate weight bearing as tolerated with a walker.  She was discharged in improved condition on house diet. Dictated by:   Oris Drone Petrarca, P.A.-C. Attending Physician:  Riley Kill,  Earna Coder T DD:  04/05/01 TD:  04/06/01 Job: [id number]

## 2010-12-27 NOTE — Op Note (Signed)
Kristine Lewis, Kristine Lewis              ACCOUNT NO.:  [id number]   MEDICAL RECORD NO.:  [id number]          PATIENT TYPE:  INP   LOCATION:  A339                          FACILITY:  [facility]   PHYSICIAN:  Vickki Hearing, M.D.DATE OF BIRTH:  [birth year]   DATE OF PROCEDURE:  09/23/2005  DATE OF DISCHARGE:                                 OPERATIVE REPORT   PREOPERATIVE DIAGNOSIS:  Fracture, left hip, reverse oblique introchanteric  - closed; fracture, left wrist, closed.   POSTOPERATIVE DIAGNOSIS:  Fracture, left hip, reverse oblique introchanteric  - closed; fracture, left wrist, closed.   PROCEDURE:  Open treatment internal fixation with intramedullary device,  left hip; long gamma nail, 125 degree angle, 100-mm lag screw, proximal set  screw, locked for no sliding, two distal locking bolts and a 380-mm nail.   OPERATIVE FINDINGS:  Reverse oblique fracture, left hip, intertrochanteric  region; left distal radius fracture, nondisplaced.   A radiograph was ordered by Dr. Phillips Odor to evaluate for right wrist  fracture. Radiograph report read of Dr. Pia Mau indicated possibility of a  fracture of the right distal radius seen on one view. I agree with Dr.  Pia Mau interpretation of the films. The patient is asymptomatic, and this  does not require treatment.   INDICATION:  Displaced proximal pertrochanteric fracture of the left hip,  nondisplaced fracture, left wrist. T   PROCEDURE NOTE:  The procedure was done as follows:  Ms. Stonehouse's left  hip was marked for surgery. Antibiotics were started in preop. She was taken  to the operating for spinal anesthetic. She was placed on a fracture table  with a peroneal post. Her right hip which has a total hip was placed in a  well leg holder in slight abduction. This was checked at the end of the  procedure to make sure no dislocation had occurred, and the hip was fine.   The left lower extremity was prepped after a closed manipulation was  done.  We draped sterilely. We took a time-out, and after that was completed, I  made an incision proximal to the greater trochanter, passed a curved awl  under C-arm guidance, passed a guidewire down to the  knee. We checked this  with the x-ray. We then reamed proximally and then continued serial reamings  up to a size 13 for the isthmus and distal femur. We passed a 125-degree  long nail after measuring size to a 38 cm. We checked our x-rays again. We  made a stab wound on the lateral thigh, passed the cannula for the drill to  perforate the cortex. Once this was done, we repeated this on two separate  occasions until we got the guide pin to go in the center of the femoral head  on AP and lateral view. We passed the cannula for the guide pin, got the pin  in the center of both views, measured 100, reamed over the wire to a 100 and  then passed the lag screw. We kept a screwdriver in place distally, placed a  locking bolt proximally, and I locked in place for fixed angle device,  allowing no sliding because the reverse oblique fracture.   We did have to irrigate the proximal wound and site of insertion of the  proximal set screw before passing the set screw.   We then closed proximally with 0 Monocryl, staples and staples for the lag  screw site. We then reprepped the leg distally, set up the distal locking  screw for distal locking holes for screw fixation. We took an AP x-ray to  mark the position of the holes, then took a lateral view, got the hole in  the center of the film under magnification, made a stab wound, took it down  to bone, drilled across the screw hole, measured and then passed a 40-mm  screw proximally. Did the same thing distally at the proximal portion of the  sliding hole and then passed the drill bit again, then measured a 440 screw  but actually required a 50-mm screw and one screw size 40 was wasted.   Wounds were irrigated and closed again with 2-0 Monocryl and  staples. We  then radiographed the entire femur and found the nail to be in good  position, and then we put the end of the bed back on the fracture table,  took the leg out of traction, we put on a short-arm cast and then we took  the patient to recovery room in stable condition.      Vickki Hearing, M.D.  Electronically Signed     SEH/MEDQ  D:  09/23/2005  T:  09/24/2005  Job:  [id number]

## 2010-12-27 NOTE — Discharge Summary (Signed)
[place]. [facility]  Patient:    JENEFER, WOERNER                     MRN: [id number] Adm. Date:  [id number] Disc. Date: 03/04/01 Attending:  Faith Rogue T Dictator:   Junie Bame, P.A. CC:         Loreta Ave, M.D.  Luciana Axe, M.D.   Discharge Summary  DISCHARGE DIAGNOSES: 1. Status post right total hip arthroplasty. 2. Anemia. 3. Possible respiratory infection.  HISTORY OF PRESENT ILLNESS:  The patient is a 77 year old with past medical history unremarkable, admitted on February 19, 2001, for right total hip arthroplasty secondary to end-stage OA by Dr. Mckinley Jewel.  The patient is a course of DVT prophylaxis.  Postoperative complications include anemia secondary to blood loss, atelectasis, and increased temperature, and cough. Physical therapy report at that time indicates the patient was ambulating minimal assist 40 feet with standard walker and transfers sit-to-stand with minimal assist.  She is weightbearing as tolerated.  The patient was transfused two units of packed red blood cells on February 21, 2001.  PAST MEDICAL HISTORY:  Osteoarthritis.  PAST SURGICAL HISTORY:  Cholecystectomy.  MEDICATIONS PRIOR TO ADMISSION:  Darvocet and Tylenol.  ALLERGIES:  SULFA.  SOCIAL HISTORY:  The patient is a widow.  Lives alone in two-level home in [place].  She has two steps to entry.  She was independent prior to admission except for shopping and cooking.  Her daughter can assist.  She denies any tobacco or alcohol use.  PRIMARY M.D.:  Dr. Luciana Axe.  FAMILY HISTORY:  Noncontributory.  HOSPITAL COURSE:  Mrs. Poncedeleon was admitted to rehabilitation for comprehensive inpatient rehabilitation where she received more than three hours of PT and OT daily.  Mrs. Gebel did fairly well in rehabilitation during her nine-day stay.  Her hospital course was significant for urinary tract infection, anemia, and orthostasis.  Mrs.  Odom was placed on Cipro 500 mg p.o. b.i.d. for urinary tract infection.  She remained on Trinsicon b.i.d. throughout her entire stay for her anemia.  On February 25, 2001, the patient experienced a syncopal episode, with dizziness upon standing. Orthostatic blood pressures were performed and demonstrated the patients  DD:  03/04/01 TD:  03/04/01 Job: [id number]

## 2010-12-27 NOTE — Discharge Summary (Signed)
NAMEJORRYN, HERSHBERGER NO.:  [id number]   MEDICAL RECORD NO.:  [id number]          PATIENT TYPE:  INP   LOCATION:  A339                          FACILITY:  [facility]   PHYSICIAN:  Vickki Hearing, M.D.DATE OF BIRTH:  [birth year]   DATE OF ADMISSION:  09/21/2005  DATE OF DISCHARGE:  02/16/2007LH                                 DISCHARGE SUMMARY   ADMISSION DIAGNOSES:  1.  Fractured left hip (intertrochanteric fracture).  2.  Fractured left distal radius.  3.  Hypokalemia.   PROCEDURE:  Open treatment internal fixation, left hip, with a long gamma  nail and application of short-arm cast, left wrist.   HISTORY:  This is an 77 year old female who came to the emergency room on  February 11 to be with a friend who is ill, went to the bathroom and fell  and fractured her left hip and left wrist. She was admitted with hypokalemia  and medical consult. She was placed in traction, given pain medication and  then prepped for surgery.   On September 23, 2005, she underwent uncomplicated open treatment internal  fixation of the left hip for reverse oblique intertrochanteric fracture. A  long gamma nail was placed, 125-degree angle, 100-mm lag screw, proximal set  screw which was locked and a 380-mm nail with two distal locking bolts. We  also applied a short-arm cast to a minimally impacted left distal radius  fracture that was well tolerated, and the patient was brought back to the  floor. She was given 2 units of blood in the PACU for hemoglobin of 9. Her  hemoglobin came up to 11 and stabilized at 10.5.   Other laboratory studies showed a potassium of 3.7 on February 16, BUN and  creatinine of 5 and 0.7, platelet count 187,000.   On discharge, the patient was slightly confused but awake and alert. She did  have some mild tachycardia 100 to 105.   Wound was clean. Leg lengths were equal. No deformities were noted at that  time.   DISCHARGE MEDICATIONS:  1.  Lovenox 40  mg subcu nightly, stop day will be October 21, 2005.  2.  Tylenol Extra Strength 2 tablets every 8 hours p.r.n. for pain.  3.  Morphine 4 mg IV q.2h. p.r.n. for breakthrough pain.  4.  Potassium 20 mEq b.i.d.   Weight bearing status is as tolerated with a platform walker. Staples are to  be removed October 03, 2005. Followup visit scheduled for one month, [id number]  [phone], call to schedule. Disposition to the [facility].   At followup, radiographs will be taken in the office.      Vickki Hearing, M.D.  Electronically Signed     SEH/MEDQ  D:  09/26/2005  T:  09/26/2005  Job:  [id number]

## 2010-12-27 NOTE — Op Note (Signed)
[place]. [facility]  Patient:    Kristine Lewis, Kristine Lewis                     MRN: [id number] Proc. Date: 02/19/01 Adm. Date:  [id number] Attending:  Colbert Ewing                           Operative Report  PREOPERATIVE DIAGNOSIS:  End-stage degenerative arthritis, right hip.  POSTOPERATIVE DIAGNOSIS:  End-stage degenerative arthritis, right hip.  PROCEDURE:  Right total hip replacement, Osteonics prosthesis.  Acetabular component 58 mm press-fit, screw fixation x 3, and 10 degree polyethylene insert.  Femoral component a size #7 Eon with a 132 degree neck angle.  A 28 mm +5 head.  Pressurized cement technique.  SURGEON:  Loreta Ave, M.D.  ASSISTANT:  Arlys John D. Petrarca, P.A.-C.  ANESTHESIA:  General.  ESTIMATED BLOOD LOSS:  150 cc.  BLOOD GIVEN:  None.  SPECIMENS:  Excised bone and soft tissue.  CULTURES:  None.  COMPLICATIONS:  None.  DRESSING:  Soft compressive with abduction pillow.  DESCRIPTION OF PROCEDURE:  Patient brought to the operating room and placed on the operating table in supine position.  After adequate anesthesia had been obtained, turned to a lateral position, right side up, prepped and draped in the usual sterile fashion.  Lateral incision along the femur extending posterior superior.  Skin and subcutaneous tissue divided.  The iliotibial band exposed, incised, and Charnley retractor put in place.  Neurovascular structures identified and protected.  External rotators and capsule taken down off the back of the intertrochanteric groove of the femur and tagged with nonabsorbable #1 Ethibond.  The hip exposed, then dislocated posteriorly. Femoral head removed one fingerbreadth above the lesser trochanter in line with the definitive component.  Acetabulum exposed.  Extensive hypertrophic labrum, periarticular spurs, and soft tissue debris all removed.  Acetabulum exposed.  Sequential reaming up to good bleeding bone 58  mm.  Acetabular component set at 45 degrees of abduction and 20 of anteversion and hammered in place.  Screw fixation because of poor bone quality was augmented with three screws rather than two.  These were pre-drilled, tapped, countersunk.  Good capturing and fixation.  A 10 degree insert placed posterosuperior.  Wound irrigated.  Retractors removed.  Femur exposed.  Sequential reaming with hand-held and power reamers up to a #7 component.  Cement restrictor placed distal to the definitive component.  The femur irrigated with the pulse irrigating device and the #7 component cemented with the 132 degree neck angle, which had been deemed the appropriate size after trial was utilized. Once the component was in place and the distal tip attached to the component before cementing, the wound irrigated.  The +5 head attached.  The hip was then reduced.  With pre-trialing, this was determined to be the excellent size.  Once this was reduced, I had excellent restoration of leg lengths. Excellent stability in flexion and extension.  External rotators and capsule were repaired to the back of the intertrochanteric groove, sutures tied over bony tunnels.  Charnley retractor removed.  The iliotibial band closed with #1 Vicryl, skin and subcutaneous tissue with Vicryl and staples.  Margins of wound injected with Marcaine.  A sterile compressive dressing applied. Returned to supine position.  Abduction pillow placed.  Anesthesia reversed, brought to recovery room.  Tolerated the surgery well with no complications. DD:  02/19/01 TD:  02/19/01 Job: [id number]

## 2010-12-27 NOTE — H&P (Signed)
NAMETANICA, GAIGE NO.:  [id number]   MEDICAL RECORD NO.:  [id number]          PATIENT TYPE:  INP   LOCATION:  A339                          FACILITY:  [facility]   PHYSICIAN:  Vickki Hearing, M.D.DATE OF BIRTH:  1919-12-10   DATE OF ADMISSION:  09/21/2005  DATE OF DISCHARGE:  LH                                HISTORY & PHYSICAL   CHIEF COMPLAINT:  Pain, left hip, left wrist.   HISTORY OF PRESENT ILLNESS:  This is an 77 year old female with left hip  pain severe, left wrist pain mild.  It is constant.  It is aggravated by  attempts to move the leg and attempts to move the arm.  It is sharp.  It is  secondary to a fall she sustained at [facility] while waiting for a  friend in the emergency room.  Pain is nonradiating.   REVIEW OF SYSTEMS:  The patient had diarrhea yesterday and upset stomach.  She has had a right total  hip replacement and did well.  She had left hip  pain prior the fall, associated with arthritis.  Other portions of the 10  point review of systems were normal.   FAMILY/SOCIAL HISTORY:  She has no drugs, alcohol or smoking.   MEDICATIONS:  Patient could not recall.  Does take an arthritis pill and  Claritin.   FAMILY HISTORY:  Noncontributory.   PAST SURGICAL HISTORY:  Cholecystectomy.   PHYSICAL EXAMINATION:  VITAL SIGNS:  Blood pressure 99/54, pulse 91,  respirations 27, temperature 97, O2 saturation 100% on room air.  GENERAL APPEARANCE:  Normal.  CARDIOVASCULAR:  Good pulses and perfusion.  Normal temperature.  EXTREMITIES:  No edema or swelling.  Minimal to no varicose veins.  Cervical  spine:  No lymphadenopathy.  Gait and station:  The patient was in traction.  EXTREMITIES:  Left lower extremity was in traction.  Alignment had been  improved.  Pain and tenderness noted over the greater trochanter of the left  hip and also in the left distal radius which was in a Volar splint.  Muscle  tone was normal in both left and  upper and lower extremities.  Joints were  stable.  Range of motion examination was deferred to decrease the pain  experienced by the patient.  SKIN:  Normal in all four extremities.  NEUROLOGICAL/PSYCHIATRIC:  Coordination was deferred.  Reflexes normal on  the right.  Sensory exam normal in all four extremities.  Oriented to time,  person, place.  Mood and affect were normal.  ABDOMEN:  Soft.   LABORATORY DATA:  Initial hemoglobin was 12.4, platelets 287,000.  Initial  potassium 3.1.  Other chem-7 values were normal.  September 22, 2005,  potassium was 3.9.  Urine showed positive nitrites, negative leukocytes, 11-  20 white cells, 3-6 red cells, many bacteria.  Cross matched two units of  blood.  Expires September 24, 2005.  Antibody screen positive.  Antibody  identified anti-D.   STUDIES:  Chest x-ray report elevated at left hemidiaphragm, likely chronic,  basilar atelectasis noted.  No acute pulmonary process.   Hip,  wrist and pelvic films:  Right total hip submitted hybrid 2 acetabular  screws stable.  Left reverse oblique intertrochanteric fracture.  Left  distal radius fracture nondisplaced, slightly impacted at the joint.   DIAGNOSIS:  1.  Hypokalemia.  2.  Fracture to the left hip.  3.  Fracture to the left wrist.   PLAN:  Admission, traction, medical consult, replace potassium, splint left  wrist.   On September 23, 2005, plan long gamma nail fixation left hip, cast  application left wrist.      Vickki Hearing, M.D.  Electronically Signed     SEH/MEDQ  D:  09/22/2005  T:  09/22/2005  Job:  [id number]

## 2011-05-19 LAB — BASIC METABOLIC PANEL
CO2: 28
Calcium: 9.6
Creatinine, Ser: 0.96
Glucose, Bld: 113 — ABNORMAL HIGH

## 2011-05-19 LAB — DIFFERENTIAL
Basophils Absolute: 0
Basophils Relative: 1
Monocytes Absolute: 0.5
Neutro Abs: 4
Neutrophils Relative %: 60

## 2011-05-19 LAB — CBC
Hemoglobin: 13.6
MCHC: 34
RDW: 12.7

## 2011-10-21 ENCOUNTER — Encounter (HOSPITAL_COMMUNITY): Payer: Self-pay | Admitting: Emergency Medicine

## 2011-10-21 ENCOUNTER — Emergency Department (HOSPITAL_COMMUNITY)
Admission: EM | Admit: 2011-10-21 | Discharge: 2011-10-21 | Disposition: A | Discharge status: Home / Self Care | Payer: Medicare Other | Attending: Emergency Medicine | Admitting: Emergency Medicine

## 2011-10-21 ENCOUNTER — Other Ambulatory Visit: Payer: Self-pay

## 2011-10-21 DIAGNOSIS — M25519 Pain in unspecified shoulder: Secondary | ICD-10-CM | POA: Insufficient documentation

## 2011-10-21 DIAGNOSIS — F0390 Unspecified dementia without behavioral disturbance: Secondary | ICD-10-CM | POA: Insufficient documentation

## 2011-10-21 DIAGNOSIS — M79609 Pain in unspecified limb: Secondary | ICD-10-CM | POA: Insufficient documentation

## 2011-10-21 DIAGNOSIS — M810 Age-related osteoporosis without current pathological fracture: Secondary | ICD-10-CM | POA: Insufficient documentation

## 2011-10-21 DIAGNOSIS — F411 Generalized anxiety disorder: Secondary | ICD-10-CM | POA: Insufficient documentation

## 2011-10-21 DIAGNOSIS — R109 Unspecified abdominal pain: Secondary | ICD-10-CM | POA: Insufficient documentation

## 2011-10-21 HISTORY — DX: Anxiety disorder, unspecified: F41.9

## 2011-10-21 HISTORY — DX: Unspecified dementia, unspecified severity, without behavioral disturbance, psychotic disturbance, mood disturbance, and anxiety: F03.90

## 2011-10-21 NOTE — ED Notes (Signed)
Pt comes from [facility] with c/o left shoulder and arm pain. Pt also c/o left groin pain. Pt has been locked in her room due to break out of norovirus at the facility. Pt denies any shoulder/arm pain at this time.

## 2011-10-21 NOTE — ED Provider Notes (Signed)
History  Scribed for Joya Gaskins, MD, the patient was seen in room APA16A/APA16A. This chart was scribed by Candelaria Stagers. The patient's care started at 3:23 PM   CSN: [id number]  Arrival date & time 10/21/11  1355   First MD Initiated Contact with Patient 10/21/11 1504      Chief Complaint  Patient presents with  . Shoulder Pain  . Arm Pain  . Groin Pain     Patient is a 78 y.o. female presenting with arm pain and groin pain. The history is provided by the nursing home and a relative. The history is limited by the condition of the patient.  Arm Pain  Groin Pain   Kristine Lewis is a 78 y.o. female who was BIBA from [facility] after she stated that she was experiencing shoulder pain, chest pain, and neck.  Pt has been quarantined in room due to norovirus.  Pt's family states that she has h/o dementia and has experienced these sx before and suspect a panic attack.  She was experiencing SOB as well.  At present pt states that she is experiencing no chest, arm, or neck pain.  She has no complaints and she is requesting discharge home     Past Medical History  Diagnosis Date  . Dementia   . Anxiety   . Osteoporosis     History reviewed. No pertinent past surgical history.  No family history on file.  History  Substance Use Topics  . Smoking status: Not on file  . Smokeless tobacco: Not on file  . Alcohol Use: No    OB History    Grav Para Term Preterm Abortions TAB SAB Ect Mult Living                  Review of Systems  Unable to perform ROS: Dementia    Allergies  Sulfonamide derivatives  Home Medications   Current Outpatient Rx  Name Route Sig Dispense Refill  . ACETAMINOPHEN 325 MG PO TABS Oral Take 650 mg by mouth daily as needed. Breakthrough pain    . ALPRAZOLAM 0.25 MG PO TABS Oral Take 0.25 mg by mouth 3 (three) times daily as needed. Agitation/anxiety    . CALCIUM CARBONATE-VITAMIN D 500-200 MG-UNIT PO TABS Oral Take 1 tablet by  mouth 2 (two) times daily.    . CYANOCOBALAMIN 1000 MCG/ML IJ SOLN Intramuscular Inject 1,000 mcg into the muscle every 30 (thirty) days.    Marland Kitchen DIPHENHYDRAMINE HCL 25 MG PO CAPS Oral Take 25 mg by mouth every 4 (four) hours as needed. hives    . ESCITALOPRAM OXALATE 20 MG PO TABS Oral Take 20 mg by mouth at bedtime.    . IBUPROFEN 400 MG PO TABS Oral Take 400 mg by mouth 3 (three) times daily with meals.    Marland Kitchen LOPERAMIDE HCL 2 MG PO CAPS Oral Take 2 mg by mouth every 4 (four) hours as needed. diarrhea    . LORAZEPAM 0.5 MG PO TABS Oral Take 0.5 mg by mouth at bedtime.    Marland Kitchen LORAZEPAM 0.5 MG PO TABS Oral Take 0.5 mg by mouth 2 (two) times daily as needed.    Marland Kitchen LORAZEPAM 0.5 MG PO TABS Oral Take 0.5 mg by mouth 2 (two) times daily as needed. Agitation/anxiety    . MEMANTINE HCL 10 MG PO TABS Oral Take 10 mg by mouth 2 (two) times daily.    . ADULT MULTIVITAMIN W/MINERALS CH Oral Take 1 tablet by mouth daily.    Marland Kitchen  RANITIDINE HCL 150 MG PO TABS Oral Take 150 mg by mouth daily.    Marland Kitchen RIVASTIGMINE 4.6 MG/24HR TD PT24 Transdermal Place 1 patch onto the skin daily.      BP 152/78  Pulse 85  Temp(Src) 98.3 F (36.8 C) (Oral)  Resp 20  Ht 5\' 4"  (1.626 m)  SpO2 94%  Physical Exam CONSTITUTIONAL: Well developed/well nourished HEAD AND FACE: Normocephalic/atraumatic EYES: EOMI/PERRL ENMT: Mucous membranes moist NECK: supple no meningeal signs SPINE:entire spine nontender CV: S1/S2 noted, no murmurs/rubs/gallops noted LUNGS: Lungs are clear to auscultation bilaterally, no apparent distress ABDOMEN: soft, nontender, no rebound or guarding GU:no cva tenderness NEURO: Pt is awake/alert, moves all extremitiesx4, pleasantly demented EXTREMITIES: pulses normal, full ROM, no tenderness noted, All extremities/joints palpated/ranged and nontender SKIN: warm, color normal PSYCH: no abnormalities of mood noted  ED Course  Procedures  DIAGNOSTIC STUDIES: Oxygen Saturation is 94% on room air, normal by my  interpretation.    COORDINATION OF CARE:  3:29 PM Consult: EDMD consult to [facility].  D/w nurse victoria, pt has been sequestered in her room due to outbreak of norovirus She reported shoulder and chest pain, she appeared anxious, she gave her xanax but she reported "I need help" so EMS called Pt denies any complaints at this time She is well appearing, no signs of trauma D/w family, they would like to take her back Seems reasonable I doubt an acute process at this time She is in no distress, no SOB noted   The patient appears reasonably screened and/or stabilized for discharge and I doubt any other medical condition or other [facility] requiring further screening, evaluation, or treatment in the ED at this time prior to discharge.     MDM  Nursing notes reviewed and considered in documentation    Date: 10/21/2011  Rate: 83  Rhythm: normal sinus rhythm  QRS Axis: normal  Intervals: normal  ST/T Wave abnormalities: normal  Conduction Disutrbances:none      I personally performed the services described in this documentation, which was scribed in my presence. The recorded information has been reviewed and considered.           Joya Gaskins, MD 10/21/11 [phone]

## 2011-10-21 NOTE — Discharge Instructions (Signed)

## 2013-05-20 ENCOUNTER — Emergency Department (HOSPITAL_COMMUNITY): Payer: Medicare Other

## 2013-05-20 ENCOUNTER — Encounter (HOSPITAL_COMMUNITY): Payer: Self-pay | Admitting: Emergency Medicine

## 2013-05-20 ENCOUNTER — Emergency Department (HOSPITAL_COMMUNITY)
Admission: EM | Admit: 2013-05-20 | Discharge: 2013-05-20 | Disposition: A | Discharge status: Home / Self Care | Payer: Medicare Other | Attending: Emergency Medicine | Admitting: Emergency Medicine

## 2013-05-20 DIAGNOSIS — F411 Generalized anxiety disorder: Secondary | ICD-10-CM | POA: Insufficient documentation

## 2013-05-20 DIAGNOSIS — R296 Repeated falls: Secondary | ICD-10-CM | POA: Insufficient documentation

## 2013-05-20 DIAGNOSIS — Z79899 Other long term (current) drug therapy: Secondary | ICD-10-CM | POA: Insufficient documentation

## 2013-05-20 DIAGNOSIS — Z791 Long term (current) use of non-steroidal anti-inflammatories (NSAID): Secondary | ICD-10-CM | POA: Insufficient documentation

## 2013-05-20 DIAGNOSIS — F0390 Unspecified dementia without behavioral disturbance: Secondary | ICD-10-CM | POA: Insufficient documentation

## 2013-05-20 DIAGNOSIS — Z87891 Personal history of nicotine dependence: Secondary | ICD-10-CM | POA: Insufficient documentation

## 2013-05-20 DIAGNOSIS — W19XXXA Unspecified fall, initial encounter: Secondary | ICD-10-CM

## 2013-05-20 DIAGNOSIS — S8990XA Unspecified injury of unspecified lower leg, initial encounter: Secondary | ICD-10-CM | POA: Insufficient documentation

## 2013-05-20 DIAGNOSIS — Y9210 Unspecified residential institution as the place of occurrence of the external cause: Secondary | ICD-10-CM | POA: Insufficient documentation

## 2013-05-20 DIAGNOSIS — M810 Age-related osteoporosis without current pathological fracture: Secondary | ICD-10-CM | POA: Insufficient documentation

## 2013-05-20 DIAGNOSIS — Y9389 Activity, other specified: Secondary | ICD-10-CM | POA: Insufficient documentation

## 2013-05-20 NOTE — ED Notes (Signed)
Patient ambulated from bed to wheelchair prior to leaving and tolerated weight on legs without difficulty.

## 2013-05-20 NOTE — ED Provider Notes (Signed)
CSN: [id number]     Arrival date & time 05/20/13  0200 History   First MD Initiated Contact with Patient 05/20/13 [phone]     Chief Complaint  Patient presents with  . Fall   (Consider location/radiation/quality/duration/timing/severity/associated sxs/prior Treatment) HPI Comments: From Nursing home with unwitnessed fall from standing. Nursing home reports patient was trying to "move furniture". Patient is not remember falling. She complains of pain in her bilateral knees. Denies hitting head or losing consciousness. She does have a history of dementia and history is limited. She is not taking anticoagulants. She walks with a walker at baseline. No chest pain, abdominal pain or back pain. No focal weakness, numbness or tingling.  The history is provided by the patient and the EMS personnel. The history is limited by the condition of the patient.    Past Medical History  Diagnosis Date  . Dementia   . Anxiety   . Osteoporosis    History reviewed. No pertinent past surgical history. No family history on file. History  Substance Use Topics  . Smoking status: Former Games developer  . Smokeless tobacco: Not on file  . Alcohol Use: No   OB History   Grav Para Term Preterm Abortions TAB SAB Ect Mult Living                 Review of Systems  Unable to perform ROS: Dementia    Allergies  Sulfonamide derivatives  Home Medications   Current Outpatient Rx  Name  Route  Sig  Dispense  Refill  . acetaminophen (TYLENOL) 325 MG tablet   Oral   Take 650 mg by mouth daily as needed. Breakthrough pain         . ALPRAZolam (XANAX) 0.25 MG tablet   Oral   Take 0.25 mg by mouth 3 (three) times daily as needed. Agitation/anxiety         . calcium-vitamin D (OSCAL WITH D) 500-200 MG-UNIT per tablet   Oral   Take 1 tablet by mouth 2 (two) times daily.         . cyanocobalamin (,VITAMIN B-12,) 1000 MCG/ML injection   Intramuscular   Inject 1,000 mcg into the muscle every 30 (thirty)  days.         . diphenhydrAMINE (BENADRYL) 25 mg capsule   Oral   Take 25 mg by mouth every 4 (four) hours as needed. hives         . escitalopram (LEXAPRO) 20 MG tablet   Oral   Take 20 mg by mouth at bedtime.         Marland Kitchen ibuprofen (ADVIL,MOTRIN) 400 MG tablet   Oral   Take 400 mg by mouth 3 (three) times daily with meals.         Marland Kitchen loperamide (IMODIUM) 2 MG capsule   Oral   Take 2 mg by mouth every 4 (four) hours as needed. diarrhea         . LORazepam (ATIVAN) 0.5 MG tablet   Oral   Take 0.5 mg by mouth at bedtime.         Marland Kitchen LORazepam (ATIVAN) 0.5 MG tablet   Oral   Take 0.5 mg by mouth 2 (two) times daily as needed.         Marland Kitchen LORazepam (ATIVAN) 0.5 MG tablet   Oral   Take 0.5 mg by mouth 2 (two) times daily as needed. Agitation/anxiety         . memantine (NAMENDA) 10 MG tablet  Oral   Take 10 mg by mouth 2 (two) times daily.         . Multiple Vitamin (MULITIVITAMIN WITH MINERALS) TABS   Oral   Take 1 tablet by mouth daily.         . ranitidine (ZANTAC) 150 MG tablet   Oral   Take 150 mg by mouth daily.         . rivastigmine (EXELON) 4.6 mg/24hr   Transdermal   Place 1 patch onto the skin daily.          BP 129/68  Pulse 92  Temp(Src) 97.6 F (36.4 C) (Oral)  Resp 20  Ht 5\' 3"  (1.6 m)  Wt 128 lb (58.06 kg)  BMI 22.68 kg/m2  SpO2 94% Physical Exam  Constitutional: She appears well-developed and well-nourished. No distress.  HENT:  Head: Normocephalic and atraumatic.  Mouth/Throat: Oropharynx is clear and moist. No oropharyngeal exudate.  Eyes: Conjunctivae and EOM are normal. Pupils are equal, round, and reactive to light.  Neck: Normal range of motion. Neck supple.  Paraspinal cervical tenderness  Cardiovascular: Normal rate, regular rhythm and normal heart sounds.   No murmur heard. Pulmonary/Chest: Effort normal and breath sounds normal. No respiratory distress. She exhibits tenderness.  Left lateral rib tenderness. No  crepitance or ecchymosis  Abdominal: Soft. There is no tenderness. There is no rebound and no guarding.  Musculoskeletal: Normal range of motion. She exhibits tenderness. She exhibits no edema.  Full range of motion of bilateral hips and knees. Tenderness palpation over bilateral knees without skin changes. Neurovascular intact distally  Neurological: She is alert. No cranial nerve deficit. She exhibits normal muscle tone. Coordination normal.  Oriented x2. Follows commands and moves all extremities equally  Skin: Skin is warm.    ED Course  Procedures (including critical care time) Labs Review Labs Reviewed - No data to display Imaging Review Dg Chest 1 View  05/20/2013   *RADIOLOGY REPORT*  Clinical Data: Status post fall; concern for chest injury.  History of smoking.  CHEST - 1 VIEW  Comparison: Chest radiograph performed 02/22/2010  Findings: There is chronic marked elevation of the left hemidiaphragm, with associated atelectasis.  There is corresponding rightward displacement of the mediastinum.  No definite pleural effusion or pneumothorax is seen.  The cardiomediastinal silhouette is borderline normal in size. Diffuse calcification is seen along the tracheobronchial tree.  No acute osseous abnormalities are seen. Multiple right-sided rib deformities appear chronic in nature, though new from 2011.  Clips are noted within the right upper quadrant, reflecting prior cholecystectomy.  IMPRESSION:  1.  No acute cardiac cardiopulmonary process seen. 2.  Multiple right-sided rib deformity which is chronic in nature, though new from 2011. 3.  Chronic marked elevation of the left hemidiaphragm, with associated atelectasis.   Original Report Authenticated By: Tonia Ghent, M.D.   Dg Pelvis 1-2 Views  05/20/2013   *RADIOLOGY REPORT*  Clinical Data: Status post fall; bilateral hip discomfort.  PELVIS - 1-2 VIEW  Comparison: Abdominal radiograph performed 09/03/2010  Findings: There is no evidence of  fracture or dislocation.  The patient's right total hip arthroplasty is grossly unremarkable in appearance, and demonstrates expected alignment.  A left femoral intramedullary rod and dynamic hip screw appear grossly intact. The distal aspect of the intramedullary rod is incompletely imaged.  Mild axial joint space narrowing is noted at the left hip, with associated sclerotic change.  Degenerative change is noted at the lower lumbar spine.  The sacrum is not  well assessed due to the overlying soft tissues.  The visualized bowel gas pattern is grossly unremarkable.  Scattered vascular calcifications are seen.  IMPRESSION:  1.  No evidence of fracture or dislocation. 2.  Right hip arthroplasty unremarkable in appearance. 3.  Mild axial joint space narrowing at the left hip, with sclerotic change; the left femoral intramedullary rod and screw appear grossly intact. 4.  Scattered vascular calcifications seen.   Original Report Authenticated By: Tonia Ghent, M.D.   Ct Head Wo Contrast  05/20/2013   *RADIOLOGY REPORT*  Clinical Data:  Status post fall; concern for head or cervical spine injury.  CT HEAD WITHOUT CONTRAST AND CT CERVICAL SPINE WITHOUT CONTRAST  Technique:  Multidetector CT imaging of the head and cervical spine was performed following the standard protocol without intravenous contrast.  Multiplanar CT image reconstructions of the cervical spine were also generated.  Comparison: CT of the head performed 02/22/2010  CT HEAD  Findings: There is no evidence of acute infarction, mass lesion, or intra- or extra-axial hemorrhage on CT.  Prominence of the ventricles and sulci reflects moderate cortical volume loss.  Cerebellar atrophy is noted.  Scattered periventricular and subcortical white matter change likely reflects small vessel ischemic microangiopathy.  The brainstem and fourth ventricle are within normal limits.  The basal ganglia are unremarkable in appearance.  The cerebral hemispheres demonstrate  grossly normal gray-white differentiation. No mass effect or midline shift is seen.  There is no evidence of fracture; visualized osseous structures are unremarkable in appearance.  The visualized portions of the orbits are within normal limits.  The paranasal sinuses and mastoid air cells are well-aerated.  No significant soft tissue abnormalities are seen.  IMPRESSION:  1.  No evidence of traumatic intracranial injury or fracture. 2.  Moderate cortical volume loss and scattered small vessel ischemic microangiopathy.  CT CERVICAL SPINE  Findings: There is no evidence of acute fracture or subluxation. There is grade 1 anterolisthesis of C2 on C3.  Extensive degenerative change is noted on the right side of the atlantoaxial articulation, and also at C6-C7, with mild chronic loss of height at C6.  There is diffuse narrowing of the intervertebral disc spaces, with prominent anterior and posterior disc space complexes.  The thyroid gland is unremarkable in appearance.  The visualized lung apices are clear.  Calcification is noted at the carotid bifurcations bilaterally.  IMPRESSION:  1.  No evidence of acute fracture or subluxation along the cervical spine. 2.  Degenerative change noted along the cervical spine. 3.  Calcification at the carotid bifurcations bilaterally.  Carotid ultrasound would be helpful for further evaluation, when and as deemed clinically appropriate.   Original Report Authenticated By: Tonia Ghent, M.D.   Ct Cervical Spine Wo Contrast  05/20/2013   *RADIOLOGY REPORT*  Clinical Data:  Status post fall; concern for head or cervical spine injury.  CT HEAD WITHOUT CONTRAST AND CT CERVICAL SPINE WITHOUT CONTRAST  Technique:  Multidetector CT imaging of the head and cervical spine was performed following the standard protocol without intravenous contrast.  Multiplanar CT image reconstructions of the cervical spine were also generated.  Comparison: CT of the head performed 02/22/2010  CT HEAD   Findings: There is no evidence of acute infarction, mass lesion, or intra- or extra-axial hemorrhage on CT.  Prominence of the ventricles and sulci reflects moderate cortical volume loss.  Cerebellar atrophy is noted.  Scattered periventricular and subcortical white matter change likely reflects small vessel ischemic microangiopathy.  The brainstem and  fourth ventricle are within normal limits.  The basal ganglia are unremarkable in appearance.  The cerebral hemispheres demonstrate grossly normal gray-white differentiation. No mass effect or midline shift is seen.  There is no evidence of fracture; visualized osseous structures are unremarkable in appearance.  The visualized portions of the orbits are within normal limits.  The paranasal sinuses and mastoid air cells are well-aerated.  No significant soft tissue abnormalities are seen.  IMPRESSION:  1.  No evidence of traumatic intracranial injury or fracture. 2.  Moderate cortical volume loss and scattered small vessel ischemic microangiopathy.  CT CERVICAL SPINE  Findings: There is no evidence of acute fracture or subluxation. There is grade 1 anterolisthesis of C2 on C3.  Extensive degenerative change is noted on the right side of the atlantoaxial articulation, and also at C6-C7, with mild chronic loss of height at C6.  There is diffuse narrowing of the intervertebral disc spaces, with prominent anterior and posterior disc space complexes.  The thyroid gland is unremarkable in appearance.  The visualized lung apices are clear.  Calcification is noted at the carotid bifurcations bilaterally.  IMPRESSION:  1.  No evidence of acute fracture or subluxation along the cervical spine. 2.  Degenerative change noted along the cervical spine. 3.  Calcification at the carotid bifurcations bilaterally.  Carotid ultrasound would be helpful for further evaluation, when and as deemed clinically appropriate.   Original Report Authenticated By: Tonia Ghent, M.D.   Dg Knee  Complete 4 Views Left  05/20/2013   *RADIOLOGY REPORT*  Clinical Data: Status post fall; bilateral knee pain.  LEFT KNEE - COMPLETE 4+ VIEW  Comparison: None.  Findings: There is no evidence of fracture or dislocation.  An intramedullary rod is noted within the distal femur.  The joint spaces are preserved.  Marginal osteophytes are seen arising at the medial compartment; the patellofemoral joint is grossly unremarkable in appearance.  No significant joint effusion is seen.  The visualized soft tissues are normal in appearance.  IMPRESSION: No evidence of fracture or dislocation.   Original Report Authenticated By: Tonia Ghent, M.D.   Dg Knee Complete 4 Views Right  05/20/2013   *RADIOLOGY REPORT*  Clinical Data: Fall, knee pain.  RIGHT KNEE - COMPLETE 4+ VIEW  Comparison: None available at time of study interpretation.  Findings: No fracture deformity nor dislocation.  Severe patellofemoral compartment narrowing with tricompartment marginal spurring and tibial spine peaking.  Minimal intradiskal calcifications.  No destructive bony lesions.  Moderate to severe vascular calcifications noted.  IMPRESSION: No acute fracture deformity nor dislocation.  Tricompartmental osteoarthrosis, severe involvement of the patellofemoral compartment.   Original Report Authenticated By: Awilda Metro    EKG Interpretation   None       MDM   1. Fall, initial encounter    Apparent fall with knee and rib pain. No loss of consciousness. no use of anticoagulants.  Chest x-ray shows chronic changes including right-sided rib fractures appear to be old. No right-sided rib tenderness today. Daughter does not recall any injury to right ribs previously.   Pelvis and knee films are negative. No evidence of acute traumatic injury.  Patient is at her baseline per family members. She has full range of motion of her bilateral hips and knees. She is able to bear weight with a walker which is her baseline.    Date:  05/20/2013  Rate: 86  Rhythm: normal sinus rhythm  QRS Axis: normal  Intervals: normal  ST/T Wave abnormalities: normal  Conduction  Disutrbances:none  Narrative Interpretation:   Old EKG Reviewed: unchanged    Glynn Octave, MD 05/20/13 [phone]

## 2013-05-20 NOTE — ED Notes (Signed)
Pt is resident of [address] where she fell.  Pt c/o pain to left ribs, both legs heavy

## 2014-02-09 ENCOUNTER — Observation Stay (HOSPITAL_COMMUNITY)
Admission: EM | Admit: 2014-02-09 | Discharge: 2014-02-09 | Disposition: A | Discharge status: Home / Self Care | Payer: Medicare Other | Attending: Emergency Medicine | Admitting: Emergency Medicine

## 2014-02-09 ENCOUNTER — Emergency Department (HOSPITAL_COMMUNITY): Payer: Medicare Other

## 2014-02-09 ENCOUNTER — Encounter (HOSPITAL_COMMUNITY): Payer: Self-pay | Admitting: Emergency Medicine

## 2014-02-09 DIAGNOSIS — Z79899 Other long term (current) drug therapy: Secondary | ICD-10-CM | POA: Diagnosis not present

## 2014-02-09 DIAGNOSIS — R5381 Other malaise: Secondary | ICD-10-CM | POA: Diagnosis present

## 2014-02-09 DIAGNOSIS — F411 Generalized anxiety disorder: Secondary | ICD-10-CM | POA: Insufficient documentation

## 2014-02-09 DIAGNOSIS — F068 Other specified mental disorders due to known physiological condition: Secondary | ICD-10-CM | POA: Diagnosis present

## 2014-02-09 DIAGNOSIS — R5383 Other fatigue: Principal | ICD-10-CM

## 2014-02-09 DIAGNOSIS — N390 Urinary tract infection, site not specified: Secondary | ICD-10-CM | POA: Diagnosis not present

## 2014-02-09 DIAGNOSIS — F0390 Unspecified dementia without behavioral disturbance: Secondary | ICD-10-CM | POA: Insufficient documentation

## 2014-02-09 DIAGNOSIS — R42 Dizziness and giddiness: Secondary | ICD-10-CM | POA: Diagnosis present

## 2014-02-09 DIAGNOSIS — M810 Age-related osteoporosis without current pathological fracture: Secondary | ICD-10-CM | POA: Diagnosis not present

## 2014-02-09 DIAGNOSIS — Z87891 Personal history of nicotine dependence: Secondary | ICD-10-CM | POA: Insufficient documentation

## 2014-02-09 DIAGNOSIS — R531 Weakness: Secondary | ICD-10-CM | POA: Diagnosis present

## 2014-02-09 LAB — CBC WITH DIFFERENTIAL/PLATELET
BASOS PCT: 1 % (ref 0–1)
Basophils Absolute: 0 10*3/uL (ref 0.0–0.1)
Eosinophils Absolute: 0.1 10*3/uL (ref 0.0–0.7)
Eosinophils Relative: 3 % (ref 0–5)
HCT: 42.3 % (ref 36.0–46.0)
HEMOGLOBIN: 14 g/dL (ref 12.0–15.0)
LYMPHS ABS: 1.2 10*3/uL (ref 0.7–4.0)
Lymphocytes Relative: 23 % (ref 12–46)
MCH: 34.3 pg — ABNORMAL HIGH (ref 26.0–34.0)
MCHC: 33.1 g/dL (ref 30.0–36.0)
MCV: 103.7 fL — ABNORMAL HIGH (ref 78.0–100.0)
MONOS PCT: 6 % (ref 3–12)
Monocytes Absolute: 0.3 10*3/uL (ref 0.1–1.0)
NEUTROS ABS: 3.5 10*3/uL (ref 1.7–7.7)
Neutrophils Relative %: 67 % (ref 43–77)
Platelets: 180 10*3/uL (ref 150–400)
RBC: 4.08 MIL/uL (ref 3.87–5.11)
RDW: 13.1 % (ref 11.5–15.5)
WBC: 5.2 10*3/uL (ref 4.0–10.5)

## 2014-02-09 LAB — BASIC METABOLIC PANEL
ANION GAP: 10 (ref 5–15)
BUN: 17 mg/dL (ref 6–23)
CHLORIDE: 102 meq/L (ref 96–112)
CO2: 31 meq/L (ref 19–32)
Calcium: 9.8 mg/dL (ref 8.4–10.5)
Creatinine, Ser: 1 mg/dL (ref 0.50–1.10)
GFR calc Af Amer: 55 mL/min — ABNORMAL LOW (ref >90)
GFR calc non Af Amer: 47 mL/min — ABNORMAL LOW (ref >90)
Glucose, Bld: 142 mg/dL — ABNORMAL HIGH (ref 70–99)
Potassium: 4.2 mEq/L (ref 3.7–5.3)
Sodium: 143 mEq/L (ref 137–147)

## 2014-02-09 LAB — URINE MICROSCOPIC-ADD ON

## 2014-02-09 LAB — URINALYSIS, ROUTINE W REFLEX MICROSCOPIC
Bilirubin Urine: NEGATIVE
GLUCOSE, UA: NEGATIVE mg/dL
Ketones, ur: NEGATIVE mg/dL
Nitrite: NEGATIVE
PH: 7 (ref 5.0–8.0)
Protein, ur: NEGATIVE mg/dL
Urobilinogen, UA: 0.2 mg/dL (ref 0.0–1.0)

## 2014-02-09 MED ORDER — DEXTROSE 5 % IV SOLN
1.0000 g | Freq: Once | INTRAVENOUS | Status: AC
  Administered 2014-02-09: 1 g via INTRAVENOUS
  Filled 2014-02-09: qty 10

## 2014-02-09 MED ORDER — CEPHALEXIN 500 MG PO CAPS: 500.0000 mg | ORAL_CAPSULE | Freq: Four times a day (QID) | ORAL | Status: DC

## 2014-02-09 NOTE — ED Notes (Signed)
Patient states generalized weakness since last night with fatigue. No pain complaints. Alert/oriented to person and place.

## 2014-02-09 NOTE — ED Notes (Signed)
NT attempted to sit pt on side of the bed and pt states "My head is swimming, I got to lay down." unable to take orthostatic vital signs for sitting and standing.

## 2014-02-09 NOTE — ED Notes (Signed)
[facility] called for update on patient. Updated on plan of care.

## 2014-02-09 NOTE — ED Notes (Signed)
Pt more alert now. Pt states he had a seizure three years ago.states he was on medication at one point but was taken off. State he had a brother that had seizures and has a nephew that has seizures

## 2014-02-09 NOTE — ED Notes (Signed)
Attempted to ambulate pt. Pt was able to take a few steps with two assist. Complain of being dizzy and knees giving out. EDP aware

## 2014-02-09 NOTE — ED Provider Notes (Signed)
CSN: [id number]     Arrival date & time 02/09/14  [id number] History  This chart was scribed for Joya Gaskinsonald W Brigido Mera, MD by Leone PayorSonum Patel, ED Scribe. This patient was seen in room APA01/APA01 and the patient's care was started 10:13 AM.    Chief Complaint  Patient presents with  . Fatigue    Patient is a 80 y.o. female presenting with weakness. The history is provided by the patient and a relative. No language interpreter was used.  Weakness This is a new problem. The current episode started 12 to 24 hours ago. The problem occurs constantly. The problem has not changed since onset.Pertinent negatives include no chest pain, no abdominal pain and no headaches. Nothing aggravates the symptoms. Nothing relieves the symptoms. She has tried rest and food for the symptoms. The treatment provided no relief.   HPI Comments: Kristine Lewis is a 80 y.o. female who presents to the Emergency Department complaining of constant, unchanged generalized weakness and fatigue that began last night. Per daughter, patient is a resident of [address] [facility] who called her because patient was found sitting on the floor. Daughter states patient did not have a fall but is unsure why she was sitting on the floor. She states patient frequently gets dizzy. Per daughter, patient had an outing with the assisted living facility and is concerned she may be dehydrated. She denies fever, vomiting, back pain, cough.   Past Medical History  Diagnosis Date  . Dementia   . Anxiety   . Osteoporosis    History reviewed. No pertinent past surgical history. No family history on file. History  Substance Use Topics  . Smoking status: Former Games developermoker  . Smokeless tobacco: Not on file  . Alcohol Use: No   OB History   Grav Para Term Preterm Abortions TAB SAB Ect Mult Living                 Review of Systems  Constitutional: Positive for fatigue. Negative for fever.  Respiratory: Negative for cough.   Cardiovascular: Negative for chest pain.   Gastrointestinal: Negative for vomiting and abdominal pain.  Musculoskeletal: Negative for back pain.  Neurological: Positive for weakness (generalized). Negative for headaches.  All other systems reviewed and are negative.     Allergies  Sulfonamide derivatives  Home Medications   Prior to Admission medications   Medication Sig Start Date End Date Taking? Authorizing Provider  acetaminophen (TYLENOL) 325 MG tablet Take 650 mg by mouth daily as needed. Breakthrough pain    Historical Provider, MD  ALPRAZolam (XANAX) 0.25 MG tablet Take 0.25 mg by mouth 3 (three) times daily as needed. Agitation/anxiety    Historical Provider, MD  calcium-vitamin D (OSCAL WITH D) 500-200 MG-UNIT per tablet Take 1 tablet by mouth 2 (two) times daily.    Historical Provider, MD  cyanocobalamin (,VITAMIN B-12,) 1000 MCG/ML injection Inject 1,000 mcg into the muscle every 30 (thirty) days.    Historical Provider, MD  diphenhydrAMINE (BENADRYL) 25 mg capsule Take 25 mg by mouth every 4 (four) hours as needed. hives    Historical Provider, MD  escitalopram (LEXAPRO) 20 MG tablet Take 20 mg by mouth at bedtime.    Historical Provider, MD  ibuprofen (ADVIL,MOTRIN) 400 MG tablet Take 400 mg by mouth 3 (three) times daily with meals.    Historical Provider, MD  loperamide (IMODIUM) 2 MG capsule Take 2 mg by mouth every 4 (four) hours as needed. diarrhea    Historical Provider, MD  LORazepam (  ATIVAN) 0.5 MG tablet Take 0.5 mg by mouth at bedtime.    Historical Provider, MD  LORazepam (ATIVAN) 0.5 MG tablet Take 0.5 mg by mouth 2 (two) times daily as needed.    Historical Provider, MD  LORazepam (ATIVAN) 0.5 MG tablet Take 0.5 mg by mouth 2 (two) times daily as needed. Agitation/anxiety    Historical Provider, MD  memantine (NAMENDA) 10 MG tablet Take 10 mg by mouth 2 (two) times daily.    Historical Provider, MD  Multiple Vitamin (MULITIVITAMIN WITH MINERALS) TABS Take 1 tablet by mouth daily.    Historical  Provider, MD  ranitidine (ZANTAC) 150 MG tablet Take 150 mg by mouth daily.    Historical Provider, MD  rivastigmine (EXELON) 4.6 mg/24hr Place 1 patch onto the skin daily.    Historical Provider, MD   BP 115/70  Pulse 86  Temp(Src) 98 F (36.7 C) (Oral)  Resp 18  Wt 130 lb (58.968 kg)  SpO2 96% Physical Exam  Nursing note and vitals reviewed.  CONSTITUTIONAL: elderly, frail, no distress noted HEAD: Normocephalic/atraumatic EYES: EOMI/PERRL ENMT: Mucous membranes moist NECK: supple no meningeal signs SPINE:entire spine nontender CV: S1/S2 noted, no murmurs/rubs/gallops noted LUNGS: Lungs are clear to auscultation bilaterally, no apparent distress ABDOMEN: soft, nontender, no rebound or guarding GU:no cva tenderness NEURO: Pt is awake/alert, moves all extremitiesx4, no facial droop, no arm or leg drift  EXTREMITIES: pulses normal, full ROM, All other extremities/joints palpated/ranged and nontender, reports hip pain while lying in bed  SKIN: warm, color normal,  PSYCH: no abnormalities of mood noted   ED Course  Procedures   DIAGNOSTIC STUDIES: Oxygen Saturation is 96% on RA, adequate by my interpretation.    COORDINATION OF CARE: 10:17 AM Discussed treatment plan with pt at bedside and pt agreed to plan.    Patient stable in the ED Initial plan to admit for UTI/dizziness She did have dizziness upon ambulation On further discussion with family, they feel she is actually at her baseline and has frequent dizzy spells They will check on patient frequently and ensure she takes her antibiotics They will return if there is any worsening symptoms over the holiday weekend  Labs Review Labs Reviewed  BASIC METABOLIC PANEL - Abnormal; Notable for the following:    Glucose, Bld 142 (*)    GFR calc non Af Amer 47 (*)    GFR calc Af Amer 55 (*)    All other components within normal limits  CBC WITH DIFFERENTIAL - Abnormal; Notable for the following:    MCV 103.7 (*)    MCH  34.3 (*)    All other components within normal limits  URINALYSIS, ROUTINE W REFLEX MICROSCOPIC - Abnormal; Notable for the following:    Specific Gravity, Urine <1.005 (*)    Hgb urine dipstick TRACE (*)    Leukocytes, UA LARGE (*)    All other components within normal limits  URINE MICROSCOPIC-ADD ON - Abnormal; Notable for the following:    Bacteria, UA MANY (*)    All other components within normal limits  URINE CULTURE  I-STAT TROPOININ, ED    Imaging Review Dg Pelvis 1-2 Views  02/09/2014   CLINICAL DATA:  Pain, fall  EXAM: PELVIS - 1-2 VIEW  COMPARISON:  None.  FINDINGS: Two views of the pelvis submitted. Study is limited by diffuse osteopenia. Mild degenerative changes pubic symphysis. There is right hip prosthesis in anatomic alignment. Degenerative changes are noted left hip joint. Metallic fixation rod and metallic fixation  pin are noted in left femur.  IMPRESSION: No acute fracture or subluxation. Diffuse osteopenia. Right hip prosthesis in anatomic alignment. Postsurgical changes left femur. Degenerative changes pubic symphysis and left hip joint.   Electronically Signed   By: Natasha Mead M.D.   On: 02/09/2014 11:17   Ct Head Wo Contrast  02/09/2014   CLINICAL DATA:  Fatigue  EXAM: CT HEAD WITHOUT CONTRAST  TECHNIQUE: Contiguous axial images were obtained from the base of the skull through the vertex without intravenous contrast.  COMPARISON:  05/20/2013  FINDINGS: No skull fracture is noted. Paranasal sinuses and mastoid air cells are unremarkable. Atherosclerotic calcifications of carotid siphon. Moderate cerebral atrophy. Again noted periventricular and patchy subcortical chronic white matter disease. No acute cortical infarction. No mass lesion is noted on this unenhanced scan.  IMPRESSION: No acute intracranial abnormality. Stable atrophy and chronic white matter disease.   Electronically Signed   By: Natasha Mead M.D.   On: 02/09/2014 11:40     EKG Interpretation   Date/Time:   Thursday February 09 2014 09:57:39 EDT Ventricular Rate:  86 PR Interval:  164 QRS Duration: 67 QT Interval:  388 QTC Calculation: 464 R Axis:   65 Text Interpretation:  Sinus rhythm Anteroseptal infarct, old Nonspecific T  abnormalities, lateral leads Confirmed by Bebe Shaggy  MD, Mayana Irigoyen ([id number]) on  02/09/2014 10:18:12 AM      MDM   Final diagnoses:  UTI (lower urinary tract infection)  Dizziness    Nursing notes including past medical history and social history reviewed and considered in documentation xrays reviewed and considered Labs/vital reviewed and considered   I personally performed the services described in this documentation, which was scribed in my presence. The recorded information has been reviewed and is accurate.      Joya Gaskins, MD 02/09/14 [phone]

## 2014-02-09 NOTE — ED Notes (Signed)
Patient's daughter states patient went on an outing yesterday with [name]). May have "over done it."

## 2014-02-11 LAB — URINE CULTURE

## 2014-02-12 ENCOUNTER — Telehealth (HOSPITAL_BASED_OUTPATIENT_CLINIC_OR_DEPARTMENT_OTHER): Payer: Self-pay | Admitting: Emergency Medicine

## 2014-02-12 NOTE — Telephone Encounter (Signed)
Post ED Visit - Positive Culture Follow-up  Culture report reviewed by antimicrobial stewardship pharmacist: []  Wes Dulaney, Pharm.D., BCPS []  Celedonio MiyamotoJeremy Frens, Pharm.D., BCPS []  Georgina PillionElizabeth Martin, [address] BCPS [x]  MillersportMinh Pham, [address]Ph[address] BCPS, AAHIVP []  Estella HuskMichelle Turner, Pharm.D., BCPS, AAHIVP  Positive urine culture Treated with Keflex, organism sensitive to the same and no further patient follow-up is required at this time.  Zeb ComfortHolland, Iseah Plouff 02/12/2014, 12:54 PM

## 2014-11-19 ENCOUNTER — Encounter (HOSPITAL_COMMUNITY): Payer: Self-pay | Admitting: *Deleted

## 2014-11-19 ENCOUNTER — Emergency Department (HOSPITAL_COMMUNITY): Payer: Medicare Other

## 2014-11-19 ENCOUNTER — Emergency Department (HOSPITAL_COMMUNITY)
Admission: EM | Admit: 2014-11-19 | Discharge: 2014-11-19 | Disposition: A | Discharge status: Home / Self Care | Payer: Medicare Other | Attending: Emergency Medicine | Admitting: Emergency Medicine

## 2014-11-19 DIAGNOSIS — Y9389 Activity, other specified: Secondary | ICD-10-CM | POA: Insufficient documentation

## 2014-11-19 DIAGNOSIS — F419 Anxiety disorder, unspecified: Secondary | ICD-10-CM | POA: Insufficient documentation

## 2014-11-19 DIAGNOSIS — F0390 Unspecified dementia without behavioral disturbance: Secondary | ICD-10-CM | POA: Diagnosis not present

## 2014-11-19 DIAGNOSIS — Z792 Long term (current) use of antibiotics: Secondary | ICD-10-CM | POA: Insufficient documentation

## 2014-11-19 DIAGNOSIS — Z87891 Personal history of nicotine dependence: Secondary | ICD-10-CM | POA: Insufficient documentation

## 2014-11-19 DIAGNOSIS — S0083XA Contusion of other part of head, initial encounter: Secondary | ICD-10-CM | POA: Insufficient documentation

## 2014-11-19 DIAGNOSIS — M810 Age-related osteoporosis without current pathological fracture: Secondary | ICD-10-CM | POA: Insufficient documentation

## 2014-11-19 DIAGNOSIS — Z79899 Other long term (current) drug therapy: Secondary | ICD-10-CM | POA: Diagnosis not present

## 2014-11-19 DIAGNOSIS — Y998 Other external cause status: Secondary | ICD-10-CM | POA: Diagnosis not present

## 2014-11-19 DIAGNOSIS — Y92091 Bathroom in other non-institutional residence as the place of occurrence of the external cause: Secondary | ICD-10-CM | POA: Diagnosis not present

## 2014-11-19 DIAGNOSIS — S0990XA Unspecified injury of head, initial encounter: Secondary | ICD-10-CM | POA: Diagnosis present

## 2014-11-19 DIAGNOSIS — S199XXA Unspecified injury of neck, initial encounter: Secondary | ICD-10-CM | POA: Insufficient documentation

## 2014-11-19 DIAGNOSIS — W01198A Fall on same level from slipping, tripping and stumbling with subsequent striking against other object, initial encounter: Secondary | ICD-10-CM | POA: Diagnosis not present

## 2014-11-19 DIAGNOSIS — W19XXXA Unspecified fall, initial encounter: Secondary | ICD-10-CM

## 2014-11-19 NOTE — ED Provider Notes (Signed)
CSN: [id number]     Arrival date & time 11/19/14  1618 History   First MD Initiated Contact with Patient 11/19/14 1621     Chief Complaint  Patient presents with  . Fall     (Consider location/radiation/quality/duration/timing/severity/associated sxs/prior Treatment) HPI Comments: Patient presents to the ER for evaluation after a fall. Patient was trying to back out of the bathroom using her seated walker and it tipped over backwards. She hit her head on the floor. No loss of consciousness. Patient complains of headache and neck pain, but thinks the neck pain is secondary to the c-collar. She has no complaints of chest pain, shortness of breath, upper and lower back pain, extremity pain, hip pain.  Patient is a 81 y.o. female presenting with fall.  Fall Associated symptoms include headaches.    Past Medical History  Diagnosis Date  . Dementia   . Anxiety   . Osteoporosis    History reviewed. No pertinent past surgical history. No family history on file. History  Substance Use Topics  . Smoking status: Former Games developer  . Smokeless tobacco: Not on file  . Alcohol Use: No   OB History    No data available     Review of Systems  Musculoskeletal: Positive for neck pain.  Neurological: Positive for headaches.  All other systems reviewed and are negative.     Allergies  Sulfonamide derivatives  Home Medications   Prior to Admission medications   Medication Sig Start Date End Date Taking? Authorizing Provider  calcium-vitamin D (OSCAL WITH D) 500-200 MG-UNIT per tablet Take 1 tablet by mouth 2 (two) times daily.   Yes Historical Provider, MD  Cranberry Juice Powder 425 MG CAPS Take 1 capsule by mouth daily.   Yes Historical Provider, MD  divalproex (DEPAKOTE) 125 MG DR tablet Take 125 mg by mouth 2 (two) times daily.   Yes Historical Provider, MD  LORazepam (ATIVAN) 0.5 MG tablet Take 0.5 mg by mouth 3 (three) times daily. At noon and bedtime.   Yes Historical Provider, MD   Melatonin 3 MG TABS Take 1 tablet by mouth at bedtime.   Yes Historical Provider, MD  memantine (NAMENDA) 5 MG tablet Take 5 mg by mouth 2 (two) times daily.   Yes Historical Provider, MD  miconazole (MICOTIN) 2 % cream Apply 1 application topically 2 (two) times daily.   Yes Historical Provider, MD  Multiple Vitamin (MULITIVITAMIN WITH MINERALS) TABS Take 1 tablet by mouth daily.   Yes Historical Provider, MD  pantoprazole (PROTONIX) 40 MG tablet Take 40 mg by mouth daily.   Yes Historical Provider, MD  rivastigmine (EXELON) 9.5 mg/24hr Place 9.5 mg onto the skin daily.   Yes Historical Provider, MD  sertraline (ZOLOFT) 50 MG tablet Take 75 mg by mouth daily.   Yes Historical Provider, MD  trimethoprim (TRIMPEX) 100 MG tablet Take 100 mg by mouth daily.   Yes Historical Provider, MD  acetaminophen (TYLENOL) 325 MG tablet Take 650 mg by mouth 2 (two) times daily. Breakthrough pain    Historical Provider, MD  cephALEXin (KEFLEX) 500 MG capsule Take 1 capsule (500 mg total) by mouth 4 (four) times daily. Patient not taking: Reported on 11/19/2014 02/09/14   Zadie Rhine, MD  cyanocobalamin (,VITAMIN B-12,) 1000 MCG/ML injection Inject 1,000 mcg into the muscle every 30 (thirty) days.    Historical Provider, MD  loperamide (IMODIUM) 2 MG capsule Take 2 mg by mouth 3 (three) times daily as needed for diarrhea or loose stools.  diarrhea    Historical Provider, MD   BP 119/69 mmHg  Pulse 84  Temp(Src) 97.5 F (36.4 C) (Oral)  Resp 16  SpO2 98% Physical Exam  Constitutional: She is oriented to person, place, and time. She appears well-developed and well-nourished. No distress.  HENT:  Head: Normocephalic. Head is with contusion.  Right Ear: Hearing normal.  Left Ear: Hearing normal.  Nose: Nose normal.  Mouth/Throat: Oropharynx is clear and moist and mucous membranes are normal.  Eyes: Conjunctivae and EOM are normal. Pupils are equal, round, and reactive to light.  Neck: Normal range of  motion. Neck supple. Muscular tenderness present. No spinous process tenderness present.  Cardiovascular: Regular rhythm, S1 normal and S2 normal.  Exam reveals no gallop and no friction rub.   No murmur heard. Pulmonary/Chest: Effort normal and breath sounds normal. No respiratory distress. She exhibits no tenderness.  Abdominal: Soft. Normal appearance and bowel sounds are normal. There is no hepatosplenomegaly. There is no tenderness. There is no rebound, no guarding, no tenderness at McBurney's point and negative Murphy's sign. No hernia.  Musculoskeletal: Normal range of motion.  Neurological: She is alert and oriented to person, place, and time. She has normal strength. No cranial nerve deficit or sensory deficit. Coordination normal. GCS eye subscore is 4. GCS verbal subscore is 5. GCS motor subscore is 6.  Skin: Skin is warm, dry and intact. No rash noted. No cyanosis.  Psychiatric: She has a normal mood and affect. Her speech is normal and behavior is normal. Thought content normal.  Nursing note and vitals reviewed.   ED Course  Procedures (including critical care time) Labs Review Labs Reviewed - No data to display  Imaging Review Ct Head Wo Contrast  11/19/2014   CLINICAL DATA:  81 year old female with confusion post fall. No loss of consciousness. Initial encounter.  EXAM: CT HEAD WITHOUT CONTRAST  CT CERVICAL SPINE WITHOUT CONTRAST  TECHNIQUE: Multidetector CT imaging of the head and cervical spine was performed following the standard protocol without intravenous contrast. Multiplanar CT image reconstructions of the cervical spine were also generated.  COMPARISON:  This exam was interpreted during a PACS downtime with limited availability of comparison cases. It has been flagged for review following the downtime. If clinically indicated after this review, an addendum will be issued providing details about comparison to prior imaging.  FINDINGS: CT HEAD FINDINGS  No skull fracture or  intracranial hemorrhage.  Global atrophy with ventricular prominence felt to be related to atrophy rather hydrocephalus.  Prominent small vessel disease type changes without CT evidence of large acute infarct.  No intracranial mass lesion noted on this unenhanced exam.  Mastoid air cells, middle ear cavities and visualized paranasal sinuses are clear.  CT CERVICAL SPINE FINDINGS  Curvature of the cervical spine convex to the right. Prominent degenerative changes most notable C1-2 and C6-7. Various degrees of spinal stenosis and foraminal narrowing. If ligamentous injury or cord injury were of high clinical concern, MR imaging could be obtained for further delineation.  No acute cervical spine fracture noted. No findings of prevertebral soft tissue swelling.  Atherosclerotic type changes carotid bifurcation. No worrisome neck mass identified. Lung apices are clear.  IMPRESSION: This exam was interpreted during a PACS downtime with limited availability of comparison cases. It has been flagged for review following the downtime. If clinically indicated after this review, an addendum will be issued providing details about comparison to prior imaging.  CT HEAD  No skull fracture or intracranial hemorrhage.  Global atrophy with ventricular prominence felt to be related to atrophy rather hydrocephalus.  Prominent small vessel disease type changes without CT evidence of large acute infarct.  CT CERVICAL SPINE  Curvature of the cervical spine convex to the right.  Prominent degenerative changes with various degrees of spinal stenosis and foraminal narrowing.  No acute cervical spine fracture noted. No findings of prevertebral soft tissue swelling.  Please see above.   Electronically Signed   By: Lacy DuverneySteven  Olson M.D.   On: 11/19/2014 18:07   Ct Cervical Spine Wo Contrast  11/19/2014   CLINICAL DATA:  81 year old female with confusion post fall. No loss of consciousness. Initial encounter.  EXAM: CT HEAD WITHOUT CONTRAST  CT  CERVICAL SPINE WITHOUT CONTRAST  TECHNIQUE: Multidetector CT imaging of the head and cervical spine was performed following the standard protocol without intravenous contrast. Multiplanar CT image reconstructions of the cervical spine were also generated.  COMPARISON:  This exam was interpreted during a PACS downtime with limited availability of comparison cases. It has been flagged for review following the downtime. If clinically indicated after this review, an addendum will be issued providing details about comparison to prior imaging.  FINDINGS: CT HEAD FINDINGS  No skull fracture or intracranial hemorrhage.  Global atrophy with ventricular prominence felt to be related to atrophy rather hydrocephalus.  Prominent small vessel disease type changes without CT evidence of large acute infarct.  No intracranial mass lesion noted on this unenhanced exam.  Mastoid air cells, middle ear cavities and visualized paranasal sinuses are clear.  CT CERVICAL SPINE FINDINGS  Curvature of the cervical spine convex to the right. Prominent degenerative changes most notable C1-2 and C6-7. Various degrees of spinal stenosis and foraminal narrowing. If ligamentous injury or cord injury were of high clinical concern, MR imaging could be obtained for further delineation.  No acute cervical spine fracture noted. No findings of prevertebral soft tissue swelling.  Atherosclerotic type changes carotid bifurcation. No worrisome neck mass identified. Lung apices are clear.  IMPRESSION: This exam was interpreted during a PACS downtime with limited availability of comparison cases. It has been flagged for review following the downtime. If clinically indicated after this review, an addendum will be issued providing details about comparison to prior imaging.  CT HEAD  No skull fracture or intracranial hemorrhage.  Global atrophy with ventricular prominence felt to be related to atrophy rather hydrocephalus.  Prominent small vessel disease type  changes without CT evidence of large acute infarct.  CT CERVICAL SPINE  Curvature of the cervical spine convex to the right.  Prominent degenerative changes with various degrees of spinal stenosis and foraminal narrowing.  No acute cervical spine fracture noted. No findings of prevertebral soft tissue swelling.  Please see above.   Electronically Signed   By: Lacy DuverneySteven  Olson M.D.   On: 11/19/2014 18:07     EKG Interpretation None      MDM   Final diagnoses:  Fall    Patient presents to the ER from assisted living after a fall. Patient fell from a seated position backwards, hitting the back of her head. There was no loss of consciousness. Patient does have a history of dementia, is at her normal baseline. She had a small contusion on the back portion of her head. CT head was unremarkable. Patient did complain of some neck pain and she did have mild tenderness in the paraspinal region, no midline tenderness. CT scan shows degenerative changes, no obvious fracture. Patient is appropriate for discharge,  return to assisted living.    Gilda Crease, MD 11/19/14 [phone]

## 2014-11-19 NOTE — Discharge Instructions (Signed)
Blunt Trauma °You have been evaluated for injuries. You have been examined and your caregiver has not found injuries serious enough to require hospitalization. °It is common to have multiple bruises and sore muscles following an accident. These tend to feel worse for the first 24 hours. You will feel more stiffness and soreness over the next several hours and worse when you wake up the first morning after your accident. After this point, you should begin to improve with each passing day. The amount of improvement depends on the amount of damage done in the accident. °Following your accident, if some part of your body does not work as it should, or if the pain in any area continues to increase, you should return to the Emergency Department for re-evaluation.  °HOME CARE INSTRUCTIONS  °Routine care for sore areas should include: °· Ice to sore areas every 2 hours for 20 minutes while awake for the next 2 days. °· Drink extra fluids (not alcohol). °· Take a hot or warm shower or bath once or twice a day to increase blood flow to sore muscles. This will help you "limber up". °· Activity as tolerated. Lifting may aggravate neck or back pain. °· Only take over-the-counter or prescription medicines for pain, discomfort, or fever as directed by your caregiver. Do not use aspirin. This may increase bruising or increase bleeding if there are small areas where this is happening. °SEEK IMMEDIATE MEDICAL CARE IF: °· Numbness, tingling, weakness, or problem with the use of your arms or legs. °· A severe headache is not relieved with medications. °· There is a change in bowel or bladder control. °· Increasing pain in any areas of the body. °· Short of breath or dizzy. °· Nauseated, vomiting, or sweating. °· Increasing belly (abdominal) discomfort. °· Blood in urine, stool, or vomiting blood. °· Pain in either shoulder in an area where a shoulder strap would be. °· Feelings of lightheadedness or if you have a fainting  episode. °Sometimes it is not possible to identify all injuries immediately after the trauma. It is important that you continue to monitor your condition after the emergency department visit. If you feel you are not improving, or improving more slowly than should be expected, call your physician. If you feel your symptoms (problems) are worsening, return to the Emergency Department immediately. °Document Released: 04/23/2001 Document Revised: 10/20/2011 Document Reviewed: 03/15/2008 °Exit[employer]® Patient Information ©2015 [employer]. This information is not intended to replace advice given to you by your health care provider. Make sure you discuss any questions you have with your health care provider. ° °

## 2014-11-19 NOTE — ED Notes (Addendum)
[facility] with c/o fall, pt alert to person, knows that she is in [place],  confused on day, pt was in a sitting walker when she fell backwards, pt states that she remembers the fall, Dr. Larose HiresPolinna at bedside upon pt arrival to er,

## 2014-11-19 NOTE — ED Notes (Signed)
Pt fell from sitting position. She was in a sitting walker and it tilted backward. Pt states neck pain (C-collar in place) Pt also states back pain. NAD. Pt answering questions appropriately at this time.

## 2015-02-25 ENCOUNTER — Emergency Department (HOSPITAL_COMMUNITY): Payer: Medicare Other

## 2015-02-25 ENCOUNTER — Encounter (HOSPITAL_COMMUNITY): Payer: Self-pay | Admitting: Emergency Medicine

## 2015-02-25 ENCOUNTER — Emergency Department (HOSPITAL_COMMUNITY)
Admission: EM | Admit: 2015-02-25 | Discharge: 2015-02-25 | Disposition: A | Discharge status: Home / Self Care | Payer: Medicare Other | Attending: Emergency Medicine | Admitting: Emergency Medicine

## 2015-02-25 DIAGNOSIS — Y998 Other external cause status: Secondary | ICD-10-CM | POA: Insufficient documentation

## 2015-02-25 DIAGNOSIS — F0390 Unspecified dementia without behavioral disturbance: Secondary | ICD-10-CM | POA: Insufficient documentation

## 2015-02-25 DIAGNOSIS — M810 Age-related osteoporosis without current pathological fracture: Secondary | ICD-10-CM | POA: Insufficient documentation

## 2015-02-25 DIAGNOSIS — W1839XA Other fall on same level, initial encounter: Secondary | ICD-10-CM | POA: Insufficient documentation

## 2015-02-25 DIAGNOSIS — Z79899 Other long term (current) drug therapy: Secondary | ICD-10-CM | POA: Insufficient documentation

## 2015-02-25 DIAGNOSIS — S61219A Laceration without foreign body of unspecified finger without damage to nail, initial encounter: Secondary | ICD-10-CM

## 2015-02-25 DIAGNOSIS — S61214A Laceration without foreign body of right ring finger without damage to nail, initial encounter: Secondary | ICD-10-CM | POA: Diagnosis not present

## 2015-02-25 DIAGNOSIS — S93402A Sprain of unspecified ligament of left ankle, initial encounter: Secondary | ICD-10-CM | POA: Diagnosis not present

## 2015-02-25 DIAGNOSIS — Z792 Long term (current) use of antibiotics: Secondary | ICD-10-CM | POA: Diagnosis not present

## 2015-02-25 DIAGNOSIS — Y9389 Activity, other specified: Secondary | ICD-10-CM | POA: Insufficient documentation

## 2015-02-25 DIAGNOSIS — F419 Anxiety disorder, unspecified: Secondary | ICD-10-CM | POA: Diagnosis not present

## 2015-02-25 DIAGNOSIS — Z87891 Personal history of nicotine dependence: Secondary | ICD-10-CM | POA: Diagnosis not present

## 2015-02-25 DIAGNOSIS — Y92128 Other place in nursing home as the place of occurrence of the external cause: Secondary | ICD-10-CM | POA: Diagnosis not present

## 2015-02-25 DIAGNOSIS — W19XXXA Unspecified fall, initial encounter: Secondary | ICD-10-CM

## 2015-02-25 NOTE — ED Notes (Signed)
[facility]. Patient alert, airway patent. Patient has laceration to tip of right ring finger. Small amount of active bleeding noted. Patient also c/o left ankle pain, swelling noted. Patient has dementia and states she doesn't know how she injured finger or ankle. Per staff no fall noted. Patient was in wheelchair upon EMS arrival.

## 2015-02-25 NOTE — Discharge Instructions (Signed)
Try to keep Steri-Strips on for several days. Keep finger dry. X-rays were normal.

## 2015-02-25 NOTE — ED Provider Notes (Signed)
CSN: [id number]     Arrival date & time 02/25/15  1548 History   First MD Initiated Contact with Patient 02/25/15 [phone]     Chief Complaint  Patient presents with  . Laceration     (Consider location/radiation/quality/duration/timing/severity/associated sxs/prior Treatment) HPI.... Level V caveat for dementia.  Status post accidental fall earlier today. Patient now complains of pain in left ankle, left foot, laceration at tip of right ring finger. No head or neck injury. Severity is mild.  Past Medical History  Diagnosis Date  . Dementia   . Anxiety   . Osteoporosis    No past surgical history on file. No family history on file. History  Substance Use Topics  . Smoking status: Former Games developer  . Smokeless tobacco: Never Used  . Alcohol Use: No   OB History    No data available     Review of Systems  Unable to perform ROS: Dementia      Allergies  Sulfonamide derivatives  Home Medications   Prior to Admission medications   Medication Sig Start Date End Date Taking? Authorizing Provider  acetaminophen (TYLENOL) 325 MG tablet Take 650 mg by mouth 2 (two) times daily. Breakthrough pain    Historical Provider, MD  calcium-vitamin D (OSCAL WITH D) 500-200 MG-UNIT per tablet Take 1 tablet by mouth 2 (two) times daily.    Historical Provider, MD  cephALEXin (KEFLEX) 500 MG capsule Take 1 capsule (500 mg total) by mouth 4 (four) times daily. Patient not taking: Reported on 11/19/2014 02/09/14   Zadie Rhine, MD  Cranberry Juice Powder 425 MG CAPS Take 1 capsule by mouth daily.    Historical Provider, MD  cyanocobalamin (,VITAMIN B-12,) 1000 MCG/ML injection Inject 1,000 mcg into the muscle every 30 (thirty) days.    Historical Provider, MD  divalproex (DEPAKOTE) 125 MG DR tablet Take 125 mg by mouth 2 (two) times daily.    Historical Provider, MD  loperamide (IMODIUM) 2 MG capsule Take 2 mg by mouth 3 (three) times daily as needed for diarrhea or loose stools. diarrhea     Historical Provider, MD  LORazepam (ATIVAN) 0.5 MG tablet Take 0.5 mg by mouth 3 (three) times daily. At noon and bedtime.    Historical Provider, MD  Melatonin 3 MG TABS Take 1 tablet by mouth at bedtime.    Historical Provider, MD  memantine (NAMENDA) 5 MG tablet Take 5 mg by mouth 2 (two) times daily.    Historical Provider, MD  miconazole (MICOTIN) 2 % cream Apply 1 application topically 2 (two) times daily.    Historical Provider, MD  Multiple Vitamin (MULITIVITAMIN WITH MINERALS) TABS Take 1 tablet by mouth daily.    Historical Provider, MD  pantoprazole (PROTONIX) 40 MG tablet Take 40 mg by mouth daily.    Historical Provider, MD  rivastigmine (EXELON) 9.5 mg/24hr Place 9.5 mg onto the skin daily.    Historical Provider, MD  sertraline (ZOLOFT) 50 MG tablet Take 75 mg by mouth daily.    Historical Provider, MD  trimethoprim (TRIMPEX) 100 MG tablet Take 100 mg by mouth daily.    Historical Provider, MD   BP 122/78 mmHg  Pulse 87  Temp(Src) 98 F (36.7 C) (Oral)  Resp 18  SpO2 96% Physical Exam  Constitutional: She is oriented to person, place, and time. She appears well-developed and well-nourished.  HENT:  Head: Normocephalic and atraumatic.  Eyes: Conjunctivae and EOM are normal. Pupils are equal, round, and reactive to light.  Neck: Normal range  of motion. Neck supple.  Cardiovascular: Normal rate and regular rhythm.   Pulmonary/Chest: Effort normal and breath sounds normal.  Abdominal: Soft. Bowel sounds are normal.  Musculoskeletal: Normal range of motion.  Minimal tenderness surrounding left ankle.  Slight ecchymosis on lateral dorsal foot  Neurological: She is alert and oriented to person, place, and time.  Skin:  Right ring finger: 0.5 cm horizontal laceration at finger tip.  Psychiatric: She has a normal mood and affect. Her behavior is normal.  Nursing note and vitals reviewed.   ED Course  LACERATION REPAIR Date/Time: 02/25/2015 7:04 PM Performed by: Donnetta HutchingOOK,  Eria Lozoya Authorized by: Donnetta HutchingOOK, Karyme Mcconathy Comments: 1800:   Right ring fingertip. Fingertip cleansed with normal saline. A quarter inch Steri-Strips 6 applied. Sterile bulky dressing.   (including critical care time) Labs Review Labs Reviewed - No data to display  Imaging Review Dg Ankle Complete Left  02/25/2015   CLINICAL DATA:  Left ankle pain and swelling, no known injury, initial encounter  EXAM: LEFT ANKLE COMPLETE - 3+ VIEW  COMPARISON:  None.  FINDINGS: Mild soft tissue swelling is noted. No acute fracture or dislocation is seen. Diffuse vascular calcifications are seen.  IMPRESSION: Mild soft tissue swelling without acute bony abnormality.   Electronically Signed   By: Alcide CleverMark  Lukens M.D.   On: 02/25/2015 17:16   Dg Finger Ring Right  02/25/2015   CLINICAL DATA:  Laceration of the fourth digit  EXAM: RIGHT RING FINGER 2+V  COMPARISON:  None.  FINDINGS: Mild degenerative changes of the interphalangeal joints are seen. Mild soft tissue swelling is noted about the PIP joint. Distal soft tissue defect is noted consistent with the clinical history. No bony abnormality is seen.  IMPRESSION: Soft tissue injury without acute bony abnormality.   Electronically Signed   By: Alcide CleverMark  Lukens M.D.   On: 02/25/2015 17:17     EKG Interpretation None      MDM   Final diagnoses:  Fall, initial encounter  Left ankle sprain, initial encounter  Laceration of finger, right, initial encounter   Patient is in no acute distress. Plain films of left ankle and right ring finger negative for fracture. Steri-Strips applied to laceration at tip of finger.    Donnetta HutchingBrian Meranda Dechaine, MD 02/25/15 Ebony [id number]

## 2015-09-27 ENCOUNTER — Other Ambulatory Visit (HOSPITAL_COMMUNITY)
Admission: RE | Admit: 2015-09-27 | Discharge: 2015-09-27 | Disposition: A | Discharge status: Home / Self Care | Payer: Medicare Other | Source: Other Acute Inpatient Hospital | Attending: Nephrology | Admitting: Nephrology

## 2015-09-27 DIAGNOSIS — Z029 Encounter for administrative examinations, unspecified: Secondary | ICD-10-CM | POA: Insufficient documentation

## 2015-09-27 LAB — URINALYSIS, ROUTINE W REFLEX MICROSCOPIC
Bilirubin Urine: NEGATIVE
Glucose, UA: NEGATIVE mg/dL
Hgb urine dipstick: NEGATIVE
Ketones, ur: NEGATIVE mg/dL
NITRITE: NEGATIVE
PH: 6.5 (ref 5.0–8.0)
Protein, ur: NEGATIVE mg/dL
SPECIFIC GRAVITY, URINE: 1.02 (ref 1.005–1.030)

## 2015-09-27 LAB — URINE MICROSCOPIC-ADD ON: RBC / HPF: NONE SEEN RBC/hpf (ref 0–5)

## 2015-09-28 LAB — URINE CULTURE

## 2016-01-09 ENCOUNTER — Emergency Department (HOSPITAL_COMMUNITY): Payer: Medicare Other

## 2016-01-09 ENCOUNTER — Emergency Department (HOSPITAL_COMMUNITY)
Admission: EM | Admit: 2016-01-09 | Discharge: 2016-01-09 | Disposition: A | Discharge status: Home / Self Care | Payer: Medicare Other | Attending: Emergency Medicine | Admitting: Emergency Medicine

## 2016-01-09 ENCOUNTER — Encounter (HOSPITAL_COMMUNITY): Payer: Self-pay | Admitting: Emergency Medicine

## 2016-01-09 DIAGNOSIS — W06XXXA Fall from bed, initial encounter: Secondary | ICD-10-CM | POA: Diagnosis not present

## 2016-01-09 DIAGNOSIS — W19XXXA Unspecified fall, initial encounter: Secondary | ICD-10-CM

## 2016-01-09 DIAGNOSIS — S51012A Laceration without foreign body of left elbow, initial encounter: Secondary | ICD-10-CM | POA: Insufficient documentation

## 2016-01-09 DIAGNOSIS — S062X0A Diffuse traumatic brain injury without loss of consciousness, initial encounter: Secondary | ICD-10-CM

## 2016-01-09 DIAGNOSIS — S0083XA Contusion of other part of head, initial encounter: Secondary | ICD-10-CM | POA: Diagnosis not present

## 2016-01-09 DIAGNOSIS — Y939 Activity, unspecified: Secondary | ICD-10-CM | POA: Diagnosis not present

## 2016-01-09 DIAGNOSIS — F0390 Unspecified dementia without behavioral disturbance: Secondary | ICD-10-CM | POA: Insufficient documentation

## 2016-01-09 DIAGNOSIS — S0990XA Unspecified injury of head, initial encounter: Secondary | ICD-10-CM | POA: Diagnosis present

## 2016-01-09 DIAGNOSIS — S81011A Laceration without foreign body, right knee, initial encounter: Secondary | ICD-10-CM | POA: Insufficient documentation

## 2016-01-09 DIAGNOSIS — Z79899 Other long term (current) drug therapy: Secondary | ICD-10-CM | POA: Diagnosis not present

## 2016-01-09 DIAGNOSIS — Y92129 Unspecified place in nursing home as the place of occurrence of the external cause: Secondary | ICD-10-CM | POA: Diagnosis not present

## 2016-01-09 DIAGNOSIS — Z87891 Personal history of nicotine dependence: Secondary | ICD-10-CM | POA: Insufficient documentation

## 2016-01-09 DIAGNOSIS — Y999 Unspecified external cause status: Secondary | ICD-10-CM | POA: Diagnosis not present

## 2016-01-09 NOTE — Discharge Instructions (Signed)
Head Injury, Adult There is a small brain bruise that requires no further treatment. Return to the ED if you develop new or worsening symptoms. You have received a head injury. It does not appear serious at this time. Headaches and vomiting are common following head injury. It should be easy to awaken from sleeping. Sometimes it is necessary for you to stay in the emergency department for a while for observation. Sometimes admission to the hospital may be needed. After injuries such as yours, most problems occur within the first 24 hours, but side effects may occur up to 7-10 days after the injury. It is important for you to carefully monitor your condition and contact your health care provider or seek immediate medical care if there is a change in your condition. WHAT ARE THE TYPES OF HEAD INJURIES? Head injuries can be as minor as a bump. Some head injuries can be more severe. More severe head injuries include:  A jarring injury to the brain (concussion).  A bruise of the brain (contusion). This mean there is bleeding in the brain that can cause swelling.  A cracked skull (skull fracture).  Bleeding in the brain that collects, clots, and forms a bump (hematoma). WHAT CAUSES A HEAD INJURY? A serious head injury is most likely to happen to someone who is in a car wreck and is not wearing a seat belt. Other causes of major head injuries include bicycle or motorcycle accidents, sports injuries, and falls. HOW ARE HEAD INJURIES DIAGNOSED? A complete history of the event leading to the injury and your current symptoms will be helpful in diagnosing head injuries. Many times, pictures of the brain, such as CT or MRI are needed to see the extent of the injury. Often, an overnight hospital stay is necessary for observation.  WHEN SHOULD I SEEK IMMEDIATE MEDICAL CARE?  You should get help right away if:  You have confusion or drowsiness.  You feel sick to your stomach (nauseous) or have continued,  forceful vomiting.  You have dizziness or unsteadiness that is getting worse.  You have severe, continued headaches not relieved by medicine. Only take over-the-counter or prescription medicines for pain, fever, or discomfort as directed by your health care provider.  You do not have normal function of the arms or legs or are unable to walk.  You notice changes in the black spots in the center of the colored part of your eye (pupil).  You have a clear or bloody fluid coming from your nose or ears.  You have a loss of vision. During the next 24 hours after the injury, you must stay with someone who can watch you for the warning signs. This person should contact local emergency services (911 in the [place] if you have seizures, you become unconscious, or you are unable to wake up. HOW CAN I PREVENT A HEAD INJURY IN THE FUTURE? The most important factor for preventing major head injuries is avoiding motor vehicle accidents. To minimize the potential for damage to your head, it is crucial to wear seat belts while riding in motor vehicles. Wearing helmets while bike riding and playing collision sports (like football) is also helpful. Also, avoiding dangerous activities around the house will further help reduce your risk of head injury.  WHEN CAN I RETURN TO NORMAL ACTIVITIES AND ATHLETICS? You should be reevaluated by your health care provider before returning to these activities. If you have any of the following symptoms, you should not return to activities or contact sports  until 1 week after the symptoms have stopped:  Persistent headache.  Dizziness or vertigo.  Poor attention and concentration.  Confusion.  Memory problems.  Nausea or vomiting.  Fatigue or tire easily.  Irritability.  Intolerant of bright lights or loud noises.  Anxiety or depression.  Disturbed sleep. MAKE SURE YOU:   Understand these instructions.  Will watch your condition.  Will get help right away if  you are not doing well or get worse.   This information is not intended to replace advice given to you by your health care provider. Make sure you discuss any questions you have with your health care provider.   Document Released: 07/28/2005 Document Revised: 08/18/2014 Document Reviewed: 04/04/2013 Elsevier Interactive Patient Education [employer]2016 [employer]

## 2016-01-09 NOTE — ED Notes (Signed)
MD at bedside. 

## 2016-01-09 NOTE — ED Provider Notes (Signed)
CSN: [id number]     Arrival date & time 01/09/16  0305 History   First MD Initiated Contact with Patient 01/09/16 [phone]     Chief Complaint  Patient presents with  . Head Injury     (Consider location/radiation/quality/duration/timing/severity/associated sxs/prior Treatment) HPI Comments: Level V caveat for dementia. Patient found on floor at her nursing facility. Apparently sustaining fall from bed. Hematoma to left forehead. No blood thinner use. Skin tears to right knee and left elbow. Tetanus is up-to-date. Daughter at bedside confirm she is at her normal mental status. She is oriented to person only. She is normally confined to a wheelchair.  The history is provided by the patient, a relative and the EMS personnel. The history is limited by the condition of the patient.    Past Medical History  Diagnosis Date  . Dementia   . Anxiety   . Osteoporosis    History reviewed. No pertinent past surgical history. History reviewed. No pertinent family history. Social History  Substance Use Topics  . Smoking status: Former Games developer  . Smokeless tobacco: Never Used  . Alcohol Use: No   OB History    No data available     Review of Systems  Unable to perform ROS: Dementia      Allergies  Sulfonamide derivatives  Home Medications   Prior to Admission medications   Medication Sig Start Date End Date Taking? Authorizing Provider  acetaminophen (TYLENOL) 325 MG tablet Take 650 mg by mouth 2 (two) times daily. Breakthrough pain   Yes Historical Provider, MD  calcium-vitamin D (OSCAL WITH D) 500-200 MG-UNIT per tablet Take 1 tablet by mouth 2 (two) times daily.   Yes Historical Provider, MD  Cranberry Juice Powder 425 MG CAPS Take 1 capsule by mouth daily.   Yes Historical Provider, MD  cyanocobalamin (,VITAMIN B-12,) 1000 MCG/ML injection Inject 1,000 mcg into the muscle every 30 (thirty) days.   Yes Historical Provider, MD  divalproex (DEPAKOTE) 125 MG DR tablet Take 125 mg by  mouth 2 (two) times daily.   Yes Historical Provider, MD  loperamide (IMODIUM) 2 MG capsule Take 2 mg by mouth 3 (three) times daily as needed for diarrhea or loose stools. diarrhea   Yes Historical Provider, MD  LORazepam (ATIVAN) 0.5 MG tablet Take 0.5 mg by mouth 3 (three) times daily. At noon and bedtime.   Yes Historical Provider, MD  Melatonin 3 MG TABS Take 1 tablet by mouth at bedtime.   Yes Historical Provider, MD  memantine (NAMENDA) 5 MG tablet Take 5 mg by mouth 2 (two) times daily.   Yes Historical Provider, MD  miconazole (MICOTIN) 2 % cream Apply 1 application topically 2 (two) times daily.   Yes Historical Provider, MD  Multiple Vitamin (MULITIVITAMIN WITH MINERALS) TABS Take 1 tablet by mouth daily.   Yes Historical Provider, MD  pantoprazole (PROTONIX) 40 MG tablet Take 40 mg by mouth daily.   Yes Historical Provider, MD  ranitidine (ZANTAC) 150 MG capsule Take 150 mg by mouth 2 (two) times daily.   Yes Historical Provider, MD  rivastigmine (EXELON) 9.5 mg/24hr Place 9.5 mg onto the skin daily.   Yes Historical Provider, MD  sertraline (ZOLOFT) 50 MG tablet Take 75 mg by mouth daily.   Yes Historical Provider, MD  trimethoprim (TRIMPEX) 100 MG tablet Take 100 mg by mouth daily.   Yes Historical Provider, MD  cephALEXin (KEFLEX) 500 MG capsule Take 1 capsule (500 mg total) by mouth 4 (four) times daily.  Patient not taking: Reported on 11/19/2014 02/09/14   Zadie Rhine, MD   BP 136/79 mmHg  Pulse 85  Temp(Src) 97.9 F (36.6 C) (Oral)  Resp 16  SpO2 95% Physical Exam  Constitutional: She is oriented to person, place, and time. She appears well-developed and well-nourished. No distress.  HENT:  Head: Normocephalic.  Mouth/Throat: Oropharynx is clear and moist. No oropharyngeal exudate.  Hematoma L forehead  Eyes: Conjunctivae and EOM are normal. Pupils are equal, round, and reactive to light.  Neck: Normal range of motion. Neck supple.  No C spine tenderness   Cardiovascular: Normal rate, regular rhythm, normal heart sounds and intact distal pulses.   No murmur heard. Pulmonary/Chest: Effort normal and breath sounds normal. No respiratory distress. She exhibits no tenderness.  Abdominal: Soft. There is no tenderness. There is no rebound and no guarding.  Musculoskeletal: Normal range of motion. She exhibits no edema or tenderness.  Skin tear L elbow and R knee without bony tenderness FROM hips without pain  Neurological: She is alert and oriented to person, place, and time. No cranial nerve deficit. She exhibits normal muscle tone. Coordination normal.  CN 2-12 intact, moving all extremities.  Skin: Skin is warm.  Psychiatric: She has a normal mood and affect. Her behavior is normal.  Nursing note and vitals reviewed.   ED Course  Procedures (including critical care time) Labs Review Labs Reviewed - No data to display  Imaging Review Dg Elbow Complete Left  01/09/2016  CLINICAL DATA:  Fall at nursing center tonight, left elbow pain and abrasion. EXAM: LEFT ELBOW - COMPLETE 3+ VIEW COMPARISON:  None. FINDINGS: No fracture or dislocation. The alignment and joint spaces are maintained. No joint effusion. Posterior soft tissue edema in the region of the olecranon bursa. IMPRESSION: Soft tissue edema.  No acute fracture or subluxation. Electronically Signed   By: Rubye Oaks M.D.   On: 01/09/2016 04:24   Ct Head Wo Contrast  01/09/2016  CLINICAL DATA:  Fall this morning striking forehead. Altered level of consciousness. EXAM: CT HEAD WITHOUT CONTRAST CT CERVICAL SPINE WITHOUT CONTRAST TECHNIQUE: Multidetector CT imaging of the head and cervical spine was performed following the standard protocol without intravenous contrast. Multiplanar CT image reconstructions of the cervical spine were also generated. COMPARISON:  Head CT 11/19/2014 FINDINGS: CT HEAD FINDINGS Mild patient motion artifact limits assessment. Probable small parenchymal contusion  in the high left frontal lobe. No surrounding edema or mass effect. Generalized atrophy and chronic small vessel ischemic change, similar in degree to prior. No hydrocephalus. The basilar cisterns are patent. No evidence of territorial infarct. No intracranial fluid collection. Left frontal scalp hematoma. Calvarium is intact. Included paranasal sinuses and mastoid air cells are well aerated. CT CERVICAL SPINE FINDINGS Advanced multilevel degenerative change throughout cervical spine similar to prior exam. No acute fracture or subluxation. There is a probable remote fracture through the right C7 pedicle it has sclerotic margins. The dens is intact. There is extensive degenerative disc disease and facet arthropathy. IMPRESSION: 1. Small hemorrhagic contusion in high left frontal lobe. No mass effect. No calvarial fracture. Background atrophy and chronic small vessel ischemia are seen. 2. Advanced multilevel degenerative change throughout cervical spine without findings of acute fracture or subluxation. Critical Value/emergent results were called by telephone at the time of interpretation on 01/09/2016 at 4:54 am tonurse Tina for [facility] , who was unavailable at the time of the exam. She verbally acknowledged these results and will convey them as soon as  possible. Electronically Signed   By: Rubye OaksMelanie  Ehinger M.D.   On: 01/09/2016 04:55   Ct Cervical Spine Wo Contrast  01/09/2016  CLINICAL DATA:  Fall this morning striking forehead. Altered level of consciousness. EXAM: CT HEAD WITHOUT CONTRAST CT CERVICAL SPINE WITHOUT CONTRAST TECHNIQUE: Multidetector CT imaging of the head and cervical spine was performed following the standard protocol without intravenous contrast. Multiplanar CT image reconstructions of the cervical spine were also generated. COMPARISON:  Head CT 11/19/2014 FINDINGS: CT HEAD FINDINGS Mild patient motion artifact limits assessment. Probable small parenchymal contusion in the high left  frontal lobe. No surrounding edema or mass effect. Generalized atrophy and chronic small vessel ischemic change, similar in degree to prior. No hydrocephalus. The basilar cisterns are patent. No evidence of territorial infarct. No intracranial fluid collection. Left frontal scalp hematoma. Calvarium is intact. Included paranasal sinuses and mastoid air cells are well aerated. CT CERVICAL SPINE FINDINGS Advanced multilevel degenerative change throughout cervical spine similar to prior exam. No acute fracture or subluxation. There is a probable remote fracture through the right C7 pedicle it has sclerotic margins. The dens is intact. There is extensive degenerative disc disease and facet arthropathy. IMPRESSION: 1. Small hemorrhagic contusion in high left frontal lobe. No mass effect. No calvarial fracture. Background atrophy and chronic small vessel ischemia are seen. 2. Advanced multilevel degenerative change throughout cervical spine without findings of acute fracture or subluxation. Critical Value/emergent results were called by telephone at the time of interpretation on 01/09/2016 at 4:54 am tonurse Tina [facility] Soucek , who was unavailable at the time of the exam. She verbally acknowledged these results and will convey them as soon as possible. Electronically Signed   By: Rubye OaksMelanie  Ehinger M.D.   On: 01/09/2016 04:55   Dg Knee Complete 4 Views Right  01/09/2016  CLINICAL DATA:  Post fall at nursing center tonight. Pain and abrasions right knee. EXAM: RIGHT KNEE - COMPLETE 4+ VIEW COMPARISON:  05/20/2013 FINDINGS: No acute fracture or dislocation. Progressive tricompartmental osteoarthritis from prior with increasing joint space narrowing and osteophytes. No joint effusion. There are dense vascular calcifications. IMPRESSION: No acute fracture or subluxation of the right knee. Progressive osteoarthritis. Electronically Signed   By: Rubye OaksMelanie  Ehinger M.D.   On: 01/09/2016 04:25   I have personally reviewed  and evaluated these images and lab results as part of my medical decision-making.   EKG Interpretation None      MDM   Final diagnoses:  Fall, initial encounter  Brain contusion, without loss of consciousness, initial encounter (HCC)   Presumed mechanical fall with hematoma to the head and skin tears. Mental status at baseline. No blood thinner use. Tetanus up-to-date.  CT head, C spine, Xray elbow.  Small hemorrhagic contusion noted. Discussed with Dr. Bevely Palmeritty of neurosurgery. He states there is no additional treatment or monitoring necessary. No need for repeat CT scan. She is neurologically intact, not on blood thinners, at her neurological baseline per her daughter.  Patient stable to return to facility. Skin tears dressed. Return precautions discussed.  Glynn OctaveStephen Joshwa Hemric, MD 01/09/16 [phone]

## 2016-01-09 NOTE — ED Notes (Signed)
Pt was found on the floor at nursing facility. Pt has knot to the left side of head.

## 2016-01-09 NOTE — ED Notes (Signed)
Skin tear to left elbow dressing with nonstick telfa, wrapped with kerlix and secured with tape.

## 2016-04-05 ENCOUNTER — Emergency Department (HOSPITAL_COMMUNITY): Payer: Medicare Other

## 2016-04-05 ENCOUNTER — Emergency Department (HOSPITAL_COMMUNITY)
Admission: EM | Admit: 2016-04-05 | Discharge: 2016-04-05 | Disposition: A | Discharge status: Home / Self Care | Payer: Medicare Other | Attending: Emergency Medicine | Admitting: Emergency Medicine

## 2016-04-05 ENCOUNTER — Encounter (HOSPITAL_COMMUNITY): Payer: Self-pay

## 2016-04-05 DIAGNOSIS — Z87891 Personal history of nicotine dependence: Secondary | ICD-10-CM | POA: Diagnosis not present

## 2016-04-05 DIAGNOSIS — S199XXA Unspecified injury of neck, initial encounter: Secondary | ICD-10-CM | POA: Diagnosis present

## 2016-04-05 DIAGNOSIS — Z79899 Other long term (current) drug therapy: Secondary | ICD-10-CM | POA: Diagnosis not present

## 2016-04-05 DIAGNOSIS — W050XXA Fall from non-moving wheelchair, initial encounter: Secondary | ICD-10-CM | POA: Diagnosis not present

## 2016-04-05 DIAGNOSIS — Y9389 Activity, other specified: Secondary | ICD-10-CM | POA: Diagnosis not present

## 2016-04-05 DIAGNOSIS — S12690A Other displaced fracture of seventh cervical vertebra, initial encounter for closed fracture: Secondary | ICD-10-CM | POA: Insufficient documentation

## 2016-04-05 DIAGNOSIS — S129XXA Fracture of neck, unspecified, initial encounter: Secondary | ICD-10-CM

## 2016-04-05 DIAGNOSIS — G309 Alzheimer's disease, unspecified: Secondary | ICD-10-CM | POA: Insufficient documentation

## 2016-04-05 DIAGNOSIS — Y999 Unspecified external cause status: Secondary | ICD-10-CM | POA: Diagnosis not present

## 2016-04-05 DIAGNOSIS — Y9289 Other specified places as the place of occurrence of the external cause: Secondary | ICD-10-CM | POA: Diagnosis not present

## 2016-04-05 HISTORY — DX: Unspecified osteoarthritis, unspecified site: M19.90

## 2016-04-05 HISTORY — DX: Gastro-esophageal reflux disease without esophagitis: K21.9

## 2016-04-05 HISTORY — DX: Anemia, unspecified: D64.9

## 2016-04-05 HISTORY — DX: Vitamin D deficiency, unspecified: E55.9

## 2016-04-05 HISTORY — DX: Alzheimer's disease, unspecified: G30.9

## 2016-04-05 HISTORY — DX: Dementia in other diseases classified elsewhere, unspecified severity, without behavioral disturbance, psychotic disturbance, mood disturbance, and anxiety: F02.80

## 2016-04-05 LAB — BASIC METABOLIC PANEL
Anion gap: 3 — ABNORMAL LOW (ref 5–15)
BUN: 23 mg/dL — ABNORMAL HIGH (ref 6–20)
CALCIUM: 8.5 mg/dL — AB (ref 8.9–10.3)
CO2: 27 mmol/L (ref 22–32)
CREATININE: 0.81 mg/dL (ref 0.44–1.00)
Chloride: 107 mmol/L (ref 101–111)
GFR calc non Af Amer: >60 mL/min (ref >60)
Glucose, Bld: 139 mg/dL — ABNORMAL HIGH (ref 65–99)
Potassium: 3.8 mmol/L (ref 3.5–5.1)
Sodium: 137 mmol/L (ref 135–145)

## 2016-04-05 LAB — URINALYSIS, ROUTINE W REFLEX MICROSCOPIC
Bilirubin Urine: NEGATIVE
Glucose, UA: NEGATIVE mg/dL
LEUKOCYTES UA: NEGATIVE
NITRITE: NEGATIVE
Protein, ur: NEGATIVE mg/dL
SPECIFIC GRAVITY, URINE: 1.02 (ref 1.005–1.030)
pH: 6 (ref 5.0–8.0)

## 2016-04-05 LAB — CBC WITH DIFFERENTIAL/PLATELET
BASOS PCT: 0 %
Basophils Absolute: 0 10*3/uL (ref 0.0–0.1)
EOS ABS: 0.1 10*3/uL (ref 0.0–0.7)
EOS PCT: 2 %
HCT: 40.9 % (ref 36.0–46.0)
HEMOGLOBIN: 13.5 g/dL (ref 12.0–15.0)
Lymphocytes Relative: 15 %
Lymphs Abs: 1.1 10*3/uL (ref 0.7–4.0)
MCH: 34.7 pg — ABNORMAL HIGH (ref 26.0–34.0)
MCHC: 33 g/dL (ref 30.0–36.0)
MCV: 105.1 fL — ABNORMAL HIGH (ref 78.0–100.0)
MONO ABS: 0.7 10*3/uL (ref 0.1–1.0)
MONOS PCT: 9 %
NEUTROS PCT: 74 %
Neutro Abs: 5.5 10*3/uL (ref 1.7–7.7)
PLATELETS: 152 10*3/uL (ref 150–400)
RBC: 3.89 MIL/uL (ref 3.87–5.11)
RDW: 12.9 % (ref 11.5–15.5)
WBC: 7.4 10*3/uL (ref 4.0–10.5)

## 2016-04-05 LAB — URINE MICROSCOPIC-ADD ON

## 2016-04-05 LAB — LACTIC ACID, PLASMA: Lactic Acid, Venous: 1.6 mmol/L (ref 0.5–1.9)

## 2016-04-05 MED ORDER — LIDOCAINE HCL (PF) 2 % IJ SOLN
INTRAMUSCULAR | Status: AC
  Filled 2016-04-05: qty 10

## 2016-04-05 NOTE — ED Notes (Signed)
Pt transported to XR.  

## 2016-04-05 NOTE — ED Provider Notes (Signed)
[facility]-EM[facility]Y DEPT Provider Note   CSN: [id number] Arrival date & time: 04/05/16  1652     History   Chief Complaint Chief Complaint  Patient presents with  . Fall    HPI Kristine Lewis is a 82 y.o. female.  The history is provided by the EMS personnel, the nursing home and a relative. The history is limited by the condition of the patient (Hx dementia).  Fall   Pt was seen at 1735. Per EMS, NH report and family: Pt sitting in her wheelchair at [facility] and fell forward onto the carpet. Pt's family states pt has hx of same. Family concerned because pt "has been sleeping more than usual" over the past 1 month. Pt's family states "the last time she was sleeping more than usual she had pneumonia."  No reported fevers, no cough, no vomiting/diarrhea. Pt has significant hx of dementia.    Past Medical History:  Diagnosis Date  . Alzheimer's dementia   . Anemia   . Anxiety   . Arthritis    osteoarthritis of knee  . Dementia   . GERD (gastroesophageal reflux disease)   . Osteoporosis   . Vitamin D deficiency     Patient Active Problem List   Diagnosis Date Noted  . UTI (lower urinary tract infection) 02/09/2014  . Dizziness 02/09/2014  . Generalized weakness 02/09/2014  . BRONCHITIS, ACUTE WITH BRONCHOSPASM 12/26/2008  . HEADACHE 12/05/2008  . DYSPNEA ON EXERTION 06/13/2008  . ALLERGIC RHINITIS, SEASONAL 12/07/2007  . PERNICIOUS ANEMIA 10/07/2006  . DEMENTIA 06/30/2006  . ANXIETY 06/30/2006  . OSTEOPOROSIS 06/30/2006    No past surgical history on file.     Home Medications    Prior to Admission medications   Medication Sig Start Date End Date Taking? Authorizing Provider  Cranberry Juice Powder 425 MG CAPS Take 1 capsule by mouth daily.   Yes Historical Provider, MD  LORazepam (ATIVAN) 0.5 MG tablet Take 0.5 mg by mouth 3 (three) times daily. At noon and bedtime.   Yes Historical Provider, MD  Melatonin 3 MG TABS Take 1 tablet by mouth at bedtime.   Yes  Historical Provider, MD  pantoprazole (PROTONIX) 40 MG tablet Take 40 mg by mouth daily.   Yes Historical Provider, MD  rivastigmine (EXELON) 9.5 mg/24hr Place 9.5 mg onto the skin daily.   Yes Historical Provider, MD  acetaminophen (TYLENOL) 325 MG tablet Take 650 mg by mouth 2 (two) times daily. Breakthrough pain    Historical Provider, MD  calcium-vitamin D (OSCAL WITH D) 500-200 MG-UNIT per tablet Take 1 tablet by mouth 2 (two) times daily.    Historical Provider, MD  cyanocobalamin (,VITAMIN B-12,) 1000 MCG/ML injection Inject 1,000 mcg into the muscle every 30 (thirty) days.    Historical Provider, MD  divalproex (DEPAKOTE) 125 MG DR tablet Take 125 mg by mouth 2 (two) times daily.    Historical Provider, MD  loperamide (IMODIUM) 2 MG capsule Take 2 mg by mouth 3 (three) times daily as needed for diarrhea or loose stools. diarrhea    Historical Provider, MD  memantine (NAMENDA) 5 MG tablet Take 5 mg by mouth 2 (two) times daily.    Historical Provider, MD  miconazole (MICOTIN) 2 % cream Apply 1 application topically 2 (two) times daily.    Historical Provider, MD  Multiple Vitamin (MULITIVITAMIN WITH MINERALS) TABS Take 1 tablet by mouth daily.    Historical Provider, MD  ranitidine (ZANTAC) 150 MG capsule Take 150 mg by mouth 2 (two)  times daily.    Historical Provider, MD  sertraline (ZOLOFT) 50 MG tablet Take 75 mg by mouth daily.    Historical Provider, MD  trimethoprim (TRIMPEX) 100 MG tablet Take 100 mg by mouth daily.    Historical Provider, MD    Family History No family history on file.  Social History Social History  Substance Use Topics  . Smoking status: Former Games developermoker  . Smokeless tobacco: Never Used  . Alcohol use No     Allergies   Sulfonamide derivatives   Review of Systems Review of Systems  Unable to perform ROS: Dementia     Physical Exam Updated Vital Signs BP 108/59   Pulse 96   Temp 98 F (36.7 C) (Oral)   Resp 14   Wt 130 lb (59 kg)   SpO2 93%    BMI 23.03 kg/m   Physical Exam 1740: Physical examination:  Nursing notes reviewed; Vital signs and O2 SAT reviewed;  Constitutional: Well developed, Well nourished, Well hydrated, In no acute distress; Head:  Normocephalic, atraumatic; Eyes: EOMI, PERRL, No scleral icterus; ENMT: Mouth and pharynx normal, Mucous membranes moist; Neck: C-collar in place, Trachea midline; Cardiovascular: Regular rate and rhythm, No gallop; Respiratory: Breath sounds clear & equal bilaterally, No wheezes. Normal respiratory effort/excursion; Chest: Nontender, Movement normal; Abdomen: Soft, Nontender, Nondistended, Normal bowel sounds; Genitourinary: No CVA tenderness; Spine:  No midline CS, TS, LS tenderness.;; Extremities: Pelvis stable. Pulses normal, No tenderness, No edema, No calf edema or asymmetry.; Neuro: Sleeping, opens her eyes when you take the blanket off of her. No facial droop. Speech clear. Moves all extremities spontaneously on stretcher.; Skin: Color normal, Warm, Dry.   ED Treatments / Results  Labs (all labs ordered are listed, but only abnormal results are displayed)   EKG  EKG Interpretation None       Radiology No results found.  Procedures Procedures (including critical care time)  Medications Ordered in ED Medications  lidocaine (XYLOCAINE) 2 % injection (not administered)     Initial Impression / Assessment and Plan / ED Course  I have reviewed the triage vital signs and the nursing notes.  Pertinent labs & imaging results that were available during my care of the patient were reviewed by me and considered in my medical decision making (see chart for details).  MDM Reviewed: previous chart, nursing note and vitals Reviewed previous: labs Interpretation: labs, x-ray and CT scan   Results for orders placed or performed during the hospital encounter of 04/05/16  Urinalysis, Routine w reflex microscopic  Result Value Ref Range   Color, Urine YELLOW YELLOW    APPearance CLEAR CLEAR   Specific Gravity, Urine 1.020 1.005 - 1.030   pH 6.0 5.0 - 8.0   Glucose, UA NEGATIVE NEGATIVE mg/dL   Hgb urine dipstick TRACE (A) NEGATIVE   Bilirubin Urine NEGATIVE NEGATIVE   Ketones, ur TRACE (A) NEGATIVE mg/dL   Protein, ur NEGATIVE NEGATIVE mg/dL   Nitrite NEGATIVE NEGATIVE   Leukocytes, UA NEGATIVE NEGATIVE  Lactic acid, plasma  Result Value Ref Range   Lactic Acid, Venous 1.6 0.5 - 1.9 mmol/L  Basic metabolic panel  Result Value Ref Range   Sodium 137 135 - 145 mmol/L   Potassium 3.8 3.5 - 5.1 mmol/L   Chloride 107 101 - 111 mmol/L   CO2 27 22 - 32 mmol/L   Glucose, Bld 139 (H) 65 - 99 mg/dL   BUN 23 (H) 6 - 20 mg/dL   Creatinine, Ser 2.950.81 0.44 -  1.00 mg/dL   Calcium 8.5 (L) 8.9 - 10.3 mg/dL   GFR calc non Af Amer >60 >60 mL/min   GFR calc Af Amer >60 >60 mL/min   Anion gap 3 (L) 5 - 15  CBC with Differential  Result Value Ref Range   WBC 7.4 4.0 - 10.5 K/uL   RBC 3.89 3.87 - 5.11 MIL/uL   Hemoglobin 13.5 12.0 - 15.0 g/dL   HCT 16.1 09.6 - 04.5 %   MCV 105.1 (H) 78.0 - 100.0 fL   MCH 34.7 (H) 26.0 - 34.0 pg   MCHC 33.0 30.0 - 36.0 g/dL   RDW 40.9 81.1 - 91.4 %   Platelets 152 150 - 400 K/uL   Neutrophils Relative % 74 %   Neutro Abs 5.5 1.7 - 7.7 K/uL   Lymphocytes Relative 15 %   Lymphs Abs 1.1 0.7 - 4.0 K/uL   Monocytes Relative 9 %   Monocytes Absolute 0.7 0.1 - 1.0 K/uL   Eosinophils Relative 2 %   Eosinophils Absolute 0.1 0.0 - 0.7 K/uL   Basophils Relative 0 %   Basophils Absolute 0.0 0.0 - 0.1 K/uL  Urine microscopic-add on  Result Value Ref Range   Squamous Epithelial / LPF 0-5 (A) NONE SEEN   WBC, UA 0-5 0 - 5 WBC/hpf   RBC / HPF 0-5 0 - 5 RBC/hpf   Bacteria, UA FEW (A) NONE SEEN   Dg Chest 2 View Result Date: 04/05/2016 CLINICAL DATA:  Status post fall.  Former smoker. EXAM: CHEST  2 VIEW COMPARISON:  05/20/2013 FINDINGS: Cardiomediastinal silhouette is displaced to the right from marked eventration of the left  hemidiaphragm with abdominal contents filling the lower 2/3 of the left hemithorax. These findings are mostly stable from 2014. There is no evidence of focal airspace consolidation, pleural effusion or pneumothorax. Osseous structures are without acute abnormality. Bilateral advanced shoulder arthropathy noted. Soft tissues are grossly normal. Chronic right ribcage deformities. 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 Electronically Signed   By: Ted Mcalpine M.D.   On: 04/05/2016 18:51   Ct Head Wo Contrast Result Date: 04/05/2016 CLINICAL DATA:  Fall face forward onto carpet. EXAM: CT HEAD WITHOUT CONTRAST CT CERVICAL SPINE WITHOUT CONTRAST TECHNIQUE: Multidetector CT imaging of the head and cervical spine was performed following the standard protocol without intravenous contrast. Multiplanar CT image reconstructions of the cervical spine were also generated. COMPARISON:  01/09/2016 FINDINGS: CT HEAD FINDINGS There is no evidence for acute hemorrhage, hydrocephalus, mass lesion, or abnormal extra-axial fluid collection. No definite CT evidence for acute infarction. Diffuse loss of parenchymal volume is consistent with atrophy. Patchy low attenuation in the deep hemispheric and periventricular white matter is nonspecific, but likely reflects chronic microvascular ischemic demyelination. Atherosclerotic calcification is visualized in the carotid arteries. No dense MCA sign. Major dural sinuses are unremarkable. No evidence for skull fracture The visualized paranasal sinuses and mastoid air cells are clear. Visualized portions of the globes and intraorbital fat are unremarkable. CT CERVICAL SPINE FINDINGS Imaging was obtained from the skull base through the T2 vertebral body. There is diffuse advanced degenerative unchanged in the cervical spine with diffuse loss of intervertebral disc height from C3-4 down to  C6-7 and associated endplate degeneration. Facet osteoarthritis is noted bilaterally. Linear lucencies are identified in the bilateral pedicles at the level of C7, suggesting fracture without displacement or distraction. This is stable since 01/09/2016 and the margins  of the fractures are irregular, consistent with nonacute injury. Patient appears to be fused on the left at C6-7 (see image 22 coronal series 9). IMPRESSION: 1. Stable head CT without acute intracranial abnormality. Atrophy with chronic small vessel white matter ischemic disease. 2. Advanced degenerative disease in the cervical spine. As described previously, nonacute fracture identified through the right C7 pedicle with nonacute left C7 pedicle fracture also visible on today's study. Electronically Signed   By: Kennith Center M.D.   On: 04/05/2016 19:22   Ct Cervical Spine Wo Contrast Result Date: 04/05/2016 CLINICAL DATA:  Fall face forward onto carpet. EXAM: CT HEAD WITHOUT CONTRAST CT CERVICAL SPINE WITHOUT CONTRAST TECHNIQUE: Multidetector CT imaging of the head and cervical spine was performed following the standard protocol without intravenous contrast. Multiplanar CT image reconstructions of the cervical spine were also generated. COMPARISON:  01/09/2016 FINDINGS: CT HEAD FINDINGS There is no evidence for acute hemorrhage, hydrocephalus, mass lesion, or abnormal extra-axial fluid collection. No definite CT evidence for acute infarction. Diffuse loss of parenchymal volume is consistent with atrophy. Patchy low attenuation in the deep hemispheric and periventricular white matter is nonspecific, but likely reflects chronic microvascular ischemic demyelination. Atherosclerotic calcification is visualized in the carotid arteries. No dense MCA sign. Major dural sinuses are unremarkable. No evidence for skull fracture The visualized paranasal sinuses and mastoid air cells are clear. Visualized portions of the globes and intraorbital fat are  unremarkable. CT CERVICAL SPINE FINDINGS Imaging was obtained from the skull base through the T2 vertebral body. There is diffuse advanced degenerative unchanged in the cervical spine with diffuse loss of intervertebral disc height from C3-4 down to C6-7 and associated endplate degeneration. Facet osteoarthritis is noted bilaterally. Linear lucencies are identified in the bilateral pedicles at the level of C7, suggesting fracture without displacement or distraction. This is stable since 01/09/2016 and the margins of the fractures are irregular, consistent with nonacute injury. Patient appears to be fused on the left at C6-7 (see image 22 coronal series 9). IMPRESSION: 1. Stable head CT without acute intracranial abnormality. Atrophy with chronic small vessel white matter ischemic disease. 2. Advanced degenerative disease in the cervical spine. As described previously, nonacute fracture identified through the right C7 pedicle with nonacute left C7 pedicle fracture also visible on today's study. Electronically Signed   By: Kennith Center M.D.   On: 04/05/2016 19:22     2025:   CT scan with new left C7 pedicle fx, but non-acute. Family unaware of initial fx. T/C to [facility] Neurosurgery Dr. Yetta Barre, case discussed, including:  HPI, pertinent PM/SHx, VS/PE, dx testing, ED course and treatment:  He has viewed the CT images, states no need for c-collar at this time. Dx and testing, as well as d/w Neurosurgeon, d/w pt's family.  Questions answered.  Verb understanding, agreeable to d/c home with outpt f/u.    Final Clinical Impressions(s) / ED Diagnoses   Final diagnoses:  None    New Prescriptions New Prescriptions   No medications on file     Samuel Jester, DO 04/10/16 1332

## 2016-04-05 NOTE — Discharge Instructions (Signed)
Take your usual prescriptions as previously directed.  Call your regular medical doctor tomorrow to schedule a follow up appointment within the next 2 days.  Return to the Emergency Department immediately sooner if worsening.  ° °

## 2016-04-05 NOTE — ED Notes (Signed)
Pt has non blanching, reddened area on R hip/buttock. Skin warm, dry, and intact.

## 2016-04-05 NOTE — ED Notes (Signed)
Spoke with Kristine Lewis at [facility] report given. All questions answered. Per her patient to be transferred back per EMS as they do not have staff/transport available.

## 2016-04-05 NOTE — ED Triage Notes (Signed)
Per EMS, pt was repositioned in wheelchair by nurse at [facility] when pt fell face forward on to carpet, pt has skin tear to L elbow. C-collar placed by EMS. Pt c/o pain to L ribcage. NAD noted. Pt poor historian due to hx of dementia.

## 2016-04-05 NOTE — ED Notes (Signed)
Pt daughter elected to take her back to [facility], called Alfonse Rasolores, nurse at [facility] and informed her who stated that was fine. Patient assisted to car by myself and Edson SnowballAngelina, ED tech

## 2016-04-10 LAB — URINE CULTURE

## 2016-04-11 NOTE — Progress Notes (Signed)
ED Antimicrobial Stewardship Positive Culture Follow Up   [name] is an 82 y.o. female who presented to [facility] on 04/05/2016 with a chief complaint of  Chief Complaint  Patient presents with  . Fall    Recent Results (from the past 720 hour(s))  Urine culture     Status: Abnormal   Collection Time: 04/05/16  6:00 PM  Result Value Ref Range Status   Specimen Description URINE, RANDOM  Final   Special Requests NONE  Final   Culture >=100,000 COLONIES/mL VIRIDANS STREPTOCOCCUS (A)  Final   Report Status 04/10/2016 FINAL  Final    UA and urine micro not indicative of infection.  No antibiotics prescribed at initial discharge from the ED.  This organism is considered a contaminant in a urine culture.  Represents asymptomatic bacteriuria and no treatment is required.   [name], Rye Dorado S 04/11/2016, 8:50 AM Infectious Diseases Pharmacist Phone# [phone]

## 2016-12-09 DEATH — deceased
